# Patient Record
Sex: Male | Born: 1945 | Race: White | Hispanic: No | Marital: Married | Smoking: Former smoker
Health system: Southern US, Community
[De-identification: ages and names within clinical notes are randomized; demographics above are authoritative.]

## PROBLEM LIST (undated history)

## (undated) DIAGNOSIS — I10 Essential (primary) hypertension: Secondary | ICD-10-CM

## (undated) DIAGNOSIS — I714 Abdominal aortic aneurysm, without rupture, unspecified: Secondary | ICD-10-CM

## (undated) DIAGNOSIS — E785 Hyperlipidemia, unspecified: Secondary | ICD-10-CM

## (undated) DIAGNOSIS — E119 Type 2 diabetes mellitus without complications: Secondary | ICD-10-CM

## (undated) DIAGNOSIS — I679 Cerebrovascular disease, unspecified: Secondary | ICD-10-CM

## (undated) DIAGNOSIS — I2510 Atherosclerotic heart disease of native coronary artery without angina pectoris: Secondary | ICD-10-CM

## (undated) HISTORY — DX: Cerebrovascular disease, unspecified: I67.9

## (undated) HISTORY — DX: Essential (primary) hypertension: I10

## (undated) HISTORY — DX: Atherosclerotic heart disease of native coronary artery without angina pectoris: I25.10

## (undated) HISTORY — DX: Hyperlipidemia, unspecified: E78.5

## (undated) HISTORY — DX: Abdominal aortic aneurysm, without rupture: I71.4

## (undated) HISTORY — DX: Abdominal aortic aneurysm, without rupture, unspecified: I71.40

## (undated) HISTORY — DX: Type 2 diabetes mellitus without complications: E11.9

---

## 1999-03-20 ENCOUNTER — Inpatient Hospital Stay (HOSPITAL_COMMUNITY): Admission: EM | Admit: 1999-03-20 | Discharge: 1999-03-24 | Payer: Self-pay | Admitting: Internal Medicine

## 2004-08-15 ENCOUNTER — Ambulatory Visit: Payer: Self-pay | Admitting: Family Medicine

## 2004-09-02 ENCOUNTER — Ambulatory Visit: Payer: Self-pay

## 2004-09-12 ENCOUNTER — Ambulatory Visit: Payer: Self-pay | Admitting: Cardiology

## 2004-12-16 ENCOUNTER — Ambulatory Visit: Payer: Self-pay | Admitting: Family Medicine

## 2005-04-07 ENCOUNTER — Ambulatory Visit: Payer: Self-pay | Admitting: Family Medicine

## 2005-04-17 ENCOUNTER — Ambulatory Visit: Payer: Self-pay | Admitting: Family Medicine

## 2005-07-17 ENCOUNTER — Ambulatory Visit: Payer: Self-pay | Admitting: Family Medicine

## 2005-09-27 ENCOUNTER — Ambulatory Visit: Payer: Self-pay | Admitting: Cardiology

## 2005-10-16 ENCOUNTER — Ambulatory Visit: Payer: Self-pay

## 2006-10-15 ENCOUNTER — Ambulatory Visit: Payer: Self-pay | Admitting: Cardiology

## 2007-10-18 ENCOUNTER — Ambulatory Visit: Payer: Self-pay | Admitting: Cardiology

## 2007-12-27 ENCOUNTER — Ambulatory Visit: Payer: Self-pay

## 2008-09-28 ENCOUNTER — Ambulatory Visit: Payer: Self-pay | Admitting: Cardiology

## 2009-01-26 ENCOUNTER — Encounter: Payer: Self-pay | Admitting: Cardiology

## 2009-03-05 ENCOUNTER — Encounter (INDEPENDENT_AMBULATORY_CARE_PROVIDER_SITE_OTHER): Payer: Self-pay | Admitting: *Deleted

## 2009-11-04 DIAGNOSIS — I2510 Atherosclerotic heart disease of native coronary artery without angina pectoris: Secondary | ICD-10-CM

## 2009-11-04 DIAGNOSIS — I679 Cerebrovascular disease, unspecified: Secondary | ICD-10-CM | POA: Insufficient documentation

## 2009-11-04 DIAGNOSIS — E785 Hyperlipidemia, unspecified: Secondary | ICD-10-CM

## 2009-11-04 DIAGNOSIS — I10 Essential (primary) hypertension: Secondary | ICD-10-CM | POA: Insufficient documentation

## 2009-11-04 DIAGNOSIS — E119 Type 2 diabetes mellitus without complications: Secondary | ICD-10-CM

## 2009-11-08 ENCOUNTER — Ambulatory Visit: Payer: Self-pay | Admitting: Cardiology

## 2009-11-08 DIAGNOSIS — I714 Abdominal aortic aneurysm, without rupture, unspecified: Secondary | ICD-10-CM | POA: Insufficient documentation

## 2009-12-30 ENCOUNTER — Telehealth (INDEPENDENT_AMBULATORY_CARE_PROVIDER_SITE_OTHER): Payer: Self-pay | Admitting: *Deleted

## 2010-01-03 ENCOUNTER — Ambulatory Visit: Payer: Self-pay | Admitting: Internal Medicine

## 2010-01-03 ENCOUNTER — Ambulatory Visit: Payer: Self-pay

## 2010-01-03 ENCOUNTER — Encounter (HOSPITAL_COMMUNITY): Admission: RE | Admit: 2010-01-03 | Discharge: 2010-02-24 | Payer: Self-pay | Admitting: Cardiology

## 2010-01-03 ENCOUNTER — Encounter: Payer: Self-pay | Admitting: Cardiology

## 2010-10-25 NOTE — Progress Notes (Signed)
Summary: Nuclear Pre-Procedure  Phone Note Outgoing Call Call back at [employer] Phone [phone]   Call placed by: Stanton Kidney, EMT-P,  December 30, 2009 2:23 PM Call placed to: Patient Action Taken: Phone Call Completed Summary of Call: Reviewed information on Myoview Information Sheet (see scanned document for further details).  Spoke with Patient.    Nuclear Med Background Indications for Stress Test: Evaluation for Ischemia, Stent Patency, PTCA Patency   History: Myocardial Perfusion Study, Stents  History Comments: '00 Stent/PTCA: RCA '07 MPS: EF= 54%, Inf. apical thinning, (-) ischemia  Symptoms: DOE    Nuclear Pre-Procedure Cardiac Risk Factors: Carotid Disease, History of Smoking, Hypertension, Lipids, NIDDM Height (in): 70

## 2010-10-25 NOTE — Assessment & Plan Note (Signed)
Summary: Cardiology Nuclear Study  Nuclear Med Background Indications for Stress Test: Evaluation for Ischemia, Stent Patency, PTCA Patency   History: Myocardial Perfusion Study, Stents  History Comments: '00 Stent/PTCA: RCA '07 MPS: EF= 54%, Inf. apical thinning, (-) ischemia  Symptoms: DOE    Nuclear Pre-Procedure Cardiac Risk Factors: Carotid Disease, History of Smoking, Hypertension, Lipids, NIDDM Caffeine/Decaff Intake: None NPO After: 7:30 PM Lungs: clear IV 0.9% NS with Angio Cath: 22g     IV Site: (R) AC IV Started by: Irean Hong RN Chest Size (in) 46     Height (in): 70 Weight (lb): 251 BMI: 36.14 Tech Comments: Held carvedilol 24 hrs.  Nuclear Med Study 1 or 2 day study:  1 day     Stress Test Type:  Eugenie Birks Reading MD:  Dietrich Pates, MD     Referring MD:  B.Crenshaw Resting Radionuclide:  Technetium 44m Tetrofosmin     Resting Radionuclide Dose:  11.0 mCi  Stress Radionuclide:  Technetium 18m Tetrofosmin     Stress Radionuclide Dose:  33.0 mCi   Stress Protocol   Lexiscan: 0.4 mg   Stress Test Technologist:  Milana Na EMT-P     Nuclear Technologist:  Domenic Polite CNMT  Rest Procedure  Myocardial perfusion imaging was performed at rest 45 minutes following the intravenous administration of Myoview Technetium 33m Tetrofosmin.  Stress Procedure  The patient received IV Lexiscan 0.4 mg over 15-seconds.  Myoview injected at 30-seconds.  There were no significant changes and chest tightness with infusion.  Quantitative spect images were obtained after a 45 minute delay.   QPS Raw Data Images:  Images were motion corrected.  Extensive soft tissue (dipahragm, bowel activity, subcutaneous fat) surround heart. Stress Images:  Inferior defect (base, mid, distal) inferoseptal defect(base) and apical defect.    Otherwise normal perfusion. Rest Images:  No significant change from the stress images. Subtraction (SDS):  No evidence of ischemia. Transient  Ischemic Dilatation:  1.07  (Normal <1.22)  Lung/Heart Ratio:  .37  (Normal <0.45)  Quantitative Gated Spect Images QGS EDV:  127 ml QGS ESV:  54 ml QGS EF:  58 % QGS cine images:  Mild inferosepta hypokinesis   Overall Impression  Exercise Capacity: Lexiscan protocol. BP Response: Normal blood pressure response. Clinical Symptoms: Atypical chest pain. ECG Impression: No significant ST segment change suggestive of ischemia. Overall Impression Comments: Inferior, inferoseptal changes consistent with scar and possible soft tissue attenuation.  No signficant ischemia. ( unchanged from reprort of previous scan, images not available to compare).  Appended Document: Cardiology Nuclear Study Correction:  Soft tissue (diaphragm, small bowel activity) underlie heart. In the initial stress images there is mild thinning in the distal inferior wall and apex.  Otherwise normal perfusion. IN the recovery images there was no significant change.  (note some difficulty given underlying small bowel activity).    On gating LVEf calculated at 58%  Disregard impression in body of report.  It is incorrect.  Appended Document: Cardiology Nuclear Study ok  Appended Document: Cardiology Nuclear Study pt aware of results

## 2010-10-25 NOTE — Assessment & Plan Note (Signed)
Summary: 1 YR F/U   CC:  yearly visit.  History of Present Illness: Jerry Sutton is a pleasant gentleman who has a history of PCI of his right coronary artery in June 2000.  His most recent Myoview was performed on October 16, 2005.  At that time, he was found to have an ejection fraction of 54%.  There was diaphragmatic attenuation and prominent apical thinning, but there was no ischemia.  His last carotid Dopplers on December 27, 2007, showed 0-39% bilateral stenosis.  His most recent abdominal ultrasound on December 27, 2007, showed a 2.6 x 2.6 dilatation of the distal abdominal aorta.  Both studies were follow-up as recommended in 2 years.  I last saw him in January of 2010. Since then the patient has dyspnea with more extreme activities but not with routine activities. It is relieved with rest. It is not associated with chest pain. There is no orthopnea, PND or pedal edema. There is no syncope or palpitations. There is no exertional chest pain.   Preventive Screening-Counseling & Management  Alcohol-Tobacco     Smoking Status: never  Current Medications (verified): 1)  Nitroglycerin 0.4 Mg Subl (Nitroglycerin) .... As Directed 2)  Lisinopril 40 Mg Tabs (Lisinopril) .... Take One Tablet By Mouth Daily 3)  Amlodipine Besylate 10 Mg Tabs (Amlodipine Besylate) .... 1/2 Tab By Mouth Once Daily 4)  Carvedilol 25 Mg Tabs (Carvedilol) .... 1/2 Tab By Mouth Two Times A Day 5)  Metformin Hcl 1000 Mg Tabs (Metformin Hcl) .Marland Kitchen.. 1 Tab By Mouth Two Times A Day 6)  Multivitamins   Tabs (Multiple Vitamin) .Marland Kitchen.. 1 Tab By Mouth Once Daily 7)  Saw Palmetto   Powd (Saw [place]) .Marland Kitchen.. 1 Tab By Mouth Once Daily 8)  Hydrochlorothiazide 25 Mg Tabs (Hydrochlorothiazide) .Marland Kitchen.. 1 Tab By Mouth Once Daily 9)  Actos 45 Mg Tabs (Pioglitazone Hcl) .Marland Kitchen.. 1 Tab By Mouth Once Daily 10)  Glimepiride 4 Mg Tabs (Glimepiride) .... 1/2 Tab By Mouth Once Daily 11)  Aspirin Ec 325 Mg Tbec (Aspirin) .... Take One Tablet By Mouth Daily 12)   Simvastatin 80 Mg Tabs (Simvastatin) .... Take One Tablet By Mouth Daily At Bedtime 13)  Niacin Cr 750 Mg Cr-Tabs (Niacin) .Marland Kitchen.. 1 Tab By Mouth Once Daily  Past History:  Past Medical History: Reviewed history from 11/04/2009 and no changes required. Current Problems:  CEREBROVASCULAR DISEASE (ICD-437.9) HYPERLIPIDEMIA (ICD-272.4) HYPERTENSION (ICD-401.9) CAD (ICD-414.00) DM (ICD-250.00)  mildly dilated abdominal aorta  Past Surgical History: Reviewed history from 11/04/2009 and no changes required.  PCI of his right coronary artery in June 2000.  Social History: Reviewed history from 11/04/2009 and no changes required. Married  Tobacco Use - No.  Smoking Status:  never  Review of Systems       Mild athralgias but no fevers or chills, productive cough, hemoptysis, dysphasia, odynophagia, melena, hematochezia, dysuria, hematuria, rash, seizure activity, orthopnea, PND, pedal edema, claudication. Remaining systems are negative.   Vital Signs:  Patient profile:   65 year old male Height:      70 inches Weight:      244 pounds BMI:     35.14 Pulse rate:   65 / minute Resp:     14 per minute BP sitting:   110 / 60  (left arm)  Vitals Entered By: Kem Parkinson (November 08, 2009 8:48 AM)  Physical Exam  General:  Well-developed well-nourished in no acute distress.  Skin is warm and dry.  HEENT is normal.  Neck is  supple. No thyromegaly.  Chest is clear to auscultation with normal expansion.  Cardiovascular exam is regular rate and rhythm.  Abdominal exam nontender or distended. No masses palpated. Extremities show no edema. neuro grossly intact    EKG  Procedure date:  11/08/2009  Findings:      Sinus rhythm at a rate of 65. No ST changes.  Impression & Recommendations:  Problem # 1:  CEREBROVASCULAR DISEASE (ICD-437.9) Continue aspirin and statin. Schedule followup carotid Dopplers in April of 2011. Orders: Carotid Duplex (Carotid Duplex)  Problem #  2:  AAA (ICD-441.4) Schedule followup abdominal ultrasound in April of 2011.  Problem # 3:  HYPERLIPIDEMIA (ICD-272.4) Continue statin and niacin. Lipids and liver monitored by his primary care. His updated medication list for this problem includes:    Simvastatin 80 Mg Tabs (Simvastatin) .Marland Kitchen... Take one tablet by mouth daily at bedtime    Niacin Cr 750 Mg Cr-tabs (Niacin) .Marland Kitchen... 1 tab by mouth once daily  Problem # 4:  HYPERTENSION (ICD-401.9) Blood pressure is controlled on present medications. Will continue. Renal function monitored by his primary care. His updated medication list for this problem includes:    Lisinopril 40 Mg Tabs (Lisinopril) .Marland Kitchen... Take one tablet by mouth daily    Amlodipine Besylate 10 Mg Tabs (Amlodipine besylate) .Marland Kitchen... 1/2 tab by mouth once daily    Carvedilol 25 Mg Tabs (Carvedilol) .Marland Kitchen... 1/2 tab by mouth two times a day    Hydrochlorothiazide 25 Mg Tabs (Hydrochlorothiazide) .Marland Kitchen... 1 tab by mouth once daily    Aspirin Ec 325 Mg Tbec (Aspirin) .Marland Kitchen... Take one tablet by mouth daily  Problem # 5:  CAD (ICD-414.00) Some dyspnea on exertion. Greater then 3 years since his previous functional study. Schedule stress Myoview for risk stratification. Continue aspirin, beta blocker, ACE inhibitor and statin. Continue diet and exercise. He does not smoke. His updated medication list for this problem includes:    Nitroglycerin 0.4 Mg Subl (Nitroglycerin) .Marland Kitchen... As directed    Lisinopril 40 Mg Tabs (Lisinopril) .Marland Kitchen... Take one tablet by mouth daily    Amlodipine Besylate 10 Mg Tabs (Amlodipine besylate) .Marland Kitchen... 1/2 tab by mouth once daily    Carvedilol 25 Mg Tabs (Carvedilol) .Marland Kitchen... 1/2 tab by mouth two times a day    Aspirin Ec 325 Mg Tbec (Aspirin) .Marland Kitchen... Take one tablet by mouth daily  Orders: Nuclear Stress Test (Nuc Stress Test)  Problem # 6:  DM (ICD-250.00) Management per primary care. His updated medication list for this problem includes:    Lisinopril 40 Mg Tabs  (Lisinopril) .Marland Kitchen... Take one tablet by mouth daily    Metformin Hcl 1000 Mg Tabs (Metformin hcl) .Marland Kitchen... 1 tab by mouth two times a day    Actos 45 Mg Tabs (Pioglitazone hcl) .Marland Kitchen... 1 tab by mouth once daily    Glimepiride 4 Mg Tabs (Glimepiride) .Marland Kitchen... 1/2 tab by mouth once daily    Aspirin Ec 325 Mg Tbec (Aspirin) .Marland Kitchen... Take one tablet by mouth daily  Other Orders: Abdominal Aorta Duplex (Abd Aorta Duplex)  Patient Instructions: 1)  Your physician recommends that you schedule a follow-up appointment in: ONE YEAR 2)  Your physician has requested that you have an abdominal aorta duplex. During this test, an ultrasound is used to evaluate the aorta. Allow 30 minutes for this exam. Do not eat after midnight the day before and avoid carbonated beverages. There are no restrictions or special instructions. 3)  Your physician has requested that you have a carotid duplex.  This test is an ultrasound of the carotid arteries in your neck. It looks at blood flow through these arteries that supply the brain with blood. Allow one hour for this exam. There are no restrictions or special instructions. 4)  Your physician has requested that you have an exercise stress myoview.  For further information please visit [email or web address]  Please follow instruction sheet, as given. 5)  SCHEDULE ALL TEST IN APRIL

## 2011-02-07 NOTE — Assessment & Plan Note (Signed)
[facility]                            CARDIOLOGY OFFICE NOTE   Jerry Sutton, Jerry Sutton Jerry Sutton                     MRN:          [id number]  DATE:10/18/2007                            DOB:          Apr 24, 1946    [name] returns for followup today.  He has a history of PCI of his  right coronary artery in June 2007.  His most recent Myoview was  performed in January of 2007.  At that time, his ejection fraction was  54%, and there was diaphragmatic attenuation and apical thinning but no  ischemia.  He also has a history of a mildly dilated abdominal aorta and  minimal plaque in his carotids.  Since I last saw him, he is doing well.  There is no dyspnea, chest pain, palpitations or syncope.  There is no  pedal edema.   His medications at present include:  1. Lopressor 1 mg p.o. b.i.d.  2. Metformin 1000 mg p.o. b.i.d.  3. Multivitamin.  4. Saw palmetto.  5. Aspirin 325 mg daily.  6. Zocor 80 mg daily.  7. Niaspan 2 grams p.o. daily.  8. Zetia 10 mg p.o. daily.  9. Hydrochlorothiazide 12.5 mg p.o. daily.  10.Actos 45 mg p.o. daily.  11.Betamethasone.  12.Amlodipine 5 mg daily.  13.Lisinopril 40 mg p.o. b.i.d.  14.Glimepiride 3 mg p.o. b.i.d.   PHYSICAL EXAMINATION:  Today shows a blood pressure of 116/69 and a  pulse of 75.  He weighs 240 pounds.  HEENT:  Normal.  NECK:  Supple.  CHEST:  Clear.  CARDIOVASCULAR:  Regular rate and rhythm.  ABDOMINAL EXAM:  Shows no tenderness.  EXTREMITIES:  Show no edema.   Electrocardiogram shows sinus rhythm at a rate of 70.  There is left  axis deviation.  There is no ST changes.   DIAGNOSES:  1. Coronary disease with history of PCI of his right coronary - he is      having no chest pain, and his most recent Myoview was low risk.  We      will continue his medical therapy including his aspirin, statin,      beta blocker, and ACE inhibitor.  He will continue risk factor      modification including diet and  exercise, and we discussed that      today.  2. Diabetes mellitus - per Dr. Benedetto Sutton.  3. Hypertension - his blood pressure is adequately controlled on his      present medications.  4. Hyperlipidemia - he will continue on his statin, niacin and Zetia.      Will have his most recent lipids, liver and renal function      forwarded to [place] for our records.  5. He needs a repeat abdominal ultrasound.  His aorta was minimally      dilated in the past.  We will also plan to repeat his carotid      Dopplers as he had mild cerebrovascular disease in the past.  He      will see [place] back in 1 year.     Jerry Frieze Jens Som, MD,  [facility]  Electronically Signed    BSC/MedQ  DD: 10/18/2007  DT: 10/18/2007  Job #: [id number]   cc:   Jerry Sutton, M.D.

## 2011-02-07 NOTE — Assessment & Plan Note (Signed)
[facility]                            CARDIOLOGY OFFICE NOTE   Zekiah, Caruth GRAM SIEDLECKI                     MRN:          [id number]  DATE:09/28/2008                            DOB:          05/07/1946    [name] is a pleasant gentleman who has a history of PCI of his right  coronary artery in June 2000.  His most recent Myoview was performed on  October 16, 2005.  At that time, he was found to have an ejection  fraction of 54%.  There was diaphragmatic attenuation and prominent  apical thinning, but there was no ischemia.  His last carotid Dopplers  on December 27, 2007, showed 0-39% bilateral stenosis.  His most recent  abdominal ultrasound on December 27, 2007, showed a 2.6 x 2.6 dilatation of  the distal abdominal aorta.  Both studies were follow-up as recommended  in 2 years.  Since I last saw him, he is doing well from symptomatic  standpoint with no chest pain, dyspnea, palpitations, or syncope.  There  is no pedal edema.   MEDICATIONS:  1. Amlodipine 10 mg p.o. daily.  2. Glimepiride 4 mg p.o. daily.  3. Metoprolol 100 mg p.o. b.i.d.  4. Metformin 1000 mg p.o. b.i.d.  5. Multivitamin.  6. Supplemental aspirin 325 mg p.o. daily.  7. Zocor 80 mg daily.  8. Niacin 2 g p.o. daily.  9. Hydrochlorothiazide 12.5 mg p.o. daily.  10.Actos.  11.Betamethasone.  12.Lisinopril 40 mg p.o. b.i.d.   PHYSICAL EXAMINATION:  VITAL SIGNS:  Blood pressure of 120/70, his pulse  is 66.  He weighs 245 pounds.  HEENT:  Normal.  NECK:  Supple.  CHEST:  Clear.  CARDIOVASCULAR:  Regular rate and rhythm.  ABDOMEN:  No tenderness.  EXTREMITIES:  No edema.   Electrocardiogram shows a sinus rhythm at a rate of 66.  There is left  axis deviation.  There are no ST changes noted.   DIAGNOSES:  1. Coronary artery disease status post percutaneous coronary      intervention of the right coronary artery - the patient is doing      well from symptomatic standpoint.  He  will continue with his      aspirin, beta-blocker, ACE inhibitor, and statin.  He will continue      risk factor modification including diet and exercise.  He does not      smoke.  2. Hypertension - his blood pressure is adequately controlled on his      present medications.  Dr. Bernette Mayers is following his renal function      and potassium.  3. Diabetes mellitus - managed per Dr. Bernette Mayers.  4. Hyperlipidemia - he will continue on his statin and niacin.  His      lipids and liver being followed by Dr. Bernette Mayers.  5. History of mildly dilated abdominal aorta - he will need followup      abdominal ultrasound in April 2011.  6. Mild cerebrovascular disease - he will need followup carotid      Dopplers in April 2011.   I will see him  back in 1 year.  We will most likely repeat his Myoview  at that time.     Madolyn Frieze Jens Som, MD, [facility]  Electronically Signed    BSC/MedQ  DD: 09/28/2008  DT: 09/29/2008  Job #: [id number]   cc:   Elease Hashimoto A. Benedetto Goad, M.D.

## 2011-02-10 NOTE — Assessment & Plan Note (Signed)
[facility]                            CARDIOLOGY OFFICE NOTE   Jerry Sutton, Jerry Sutton                     MRN:          [id number]  DATE:10/15/2006                            DOB:          Feb 25, 1946    Jerry Sutton returns for followup today. He is a pleasant gentleman who  has a history of coronary disease. When I last saw him in January 2007,  he was under a significant amount of stress and was complaining of  occasional chest pain. We did schedule him to have a nuclear study,  which was performed on October 16, 2005. At that time, he was found to  have an ejection fraction of 54%. There was diaphragmatic attentuation  and apical thinning, but there was no significant ischemia noted. He had  an ultrasound of his abdomen in January of 2007, that showed mild  dilatation of the distal aorta measuring 2.3 x 2.2 cm. Followup was  recommended in 2 years. Since I last saw him, he is doing well. There is  no dyspnea, chest pain, palpitations, or syncope.   His medications include:  1. Metoprolol 100 mg p.o. b.i.d.  2. Felodipine 5 mg p.o. daily.  3. Lisinopril 40 mg tablets one-half p.o. b.i.d.  4. Metformin.  5. Multivitamin.  6. Saw palmetto.  7. Glimepiride 4 mg p.o. daily.  8. Aspirin 325 mg p.o. daily.  9. Zocor 80 mg p.o. nightly.  10.Niacin 1 g p.o. daily.  11.Zetia 10 mg p.o. daily.  12.Hydrochlorothiazide 12.5 mg p.o. daily.  13.Actos.  14.Betamethasone.   PHYSICAL EXAMINATION:  Shows a blood pressure of 121/71, pulse of 72. He  weighs 241 pounds.  NECK: Supple, with no bruits.  CHEST: Clear.  CARDIOVASCULAR: Reveals a regular rate and rhythm.  EXTREMITIES: Show no edema.   His electrocardiogram shows a sinus rhythm at a rate of 68. There are no  ST changes noted.   I do have lipids from June 04, 2006. At that time, his total  cholesterol was 118, with an HDL of 61 and an LDL of 42. His liver  functions were normal.   DIAGNOSES:  1. Coronary artery disease, status post percutaneous coronary      intervention (PCI) of the right coronary artery.  2. Diabetes mellitus.  3. Hypertension.  4. Hyperlipidemia.   PLAN:  Jerry Sutton is doing well from a symptomatic standpoint. His  lipids recently were outstanding. His blood pressure is well-controlled  on his present medications. We discussed diet and exercise. He does not  smoke. I will have him return to see [place] in 1 year. We will most likely  repeat the abdominal ultrasound at that time as there was mild  dilatation and 2 year followup was recommended.     Madolyn Frieze Jens Som, MD, [facility]  Electronically Signed    BSC/MedQ  DD: 10/15/2006  DT: 10/15/2006  Job #: [id number]   cc:   Elease Hashimoto A. Benedetto Goad, M.D.

## 2012-05-28 ENCOUNTER — Telehealth: Payer: Self-pay | Admitting: Cardiology

## 2012-05-28 MED ORDER — NITROGLYCERIN 0.4 MG SL SUBL: 0.4000 mg | SUBLINGUAL_TABLET | SUBLINGUAL | Status: DC | PRN

## 2012-05-28 NOTE — Telephone Encounter (Signed)
Pt request refill on nitro pills, no RX available.  Please send RX to [facility] in [place] and advise pt of med refill.

## 2012-06-19 ENCOUNTER — Encounter: Payer: Self-pay | Admitting: Cardiology

## 2012-06-19 ENCOUNTER — Encounter: Payer: Self-pay | Admitting: *Deleted

## 2012-06-20 ENCOUNTER — Encounter: Payer: Self-pay | Admitting: Cardiology

## 2012-06-20 ENCOUNTER — Ambulatory Visit (INDEPENDENT_AMBULATORY_CARE_PROVIDER_SITE_OTHER): Payer: Medicare Other | Admitting: Cardiology

## 2012-06-20 VITALS — BP 113/71 | HR 63 | Ht 70.0 in | Wt 251.0 lb

## 2012-06-20 DIAGNOSIS — E785 Hyperlipidemia, unspecified: Secondary | ICD-10-CM

## 2012-06-20 DIAGNOSIS — I2510 Atherosclerotic heart disease of native coronary artery without angina pectoris: Secondary | ICD-10-CM

## 2012-06-20 DIAGNOSIS — I714 Abdominal aortic aneurysm, without rupture: Secondary | ICD-10-CM

## 2012-06-20 DIAGNOSIS — I679 Cerebrovascular disease, unspecified: Secondary | ICD-10-CM

## 2012-06-20 NOTE — Assessment & Plan Note (Signed)
Continue statin. 

## 2012-06-20 NOTE — Assessment & Plan Note (Signed)
Plan follow-up abdominal ultrasound. 

## 2012-06-20 NOTE — Assessment & Plan Note (Signed)
Continue aspirin and statin. Plan followup carotid Dopplers in one year.

## 2012-06-20 NOTE — Assessment & Plan Note (Signed)
Blood pressure controlled. Continue present medications. 

## 2012-06-20 NOTE — Progress Notes (Signed)
   HPI:Mr. Lahue is a pleasant gentleman who has a history of PCI of his right coronary artery in June 2000.  His most recent Myoview was performed in April 2011.  At that time, he was found to have an ejection fraction of 58%.  There was inferior and inferoseptal scar/soft tissue attenuation, but there was no ischemia.  His last carotid Dopplers in April 2011 showed 0-39% bilateral stenosis.  His most recent abdominal ultrasound in April of 2011 showed a 2.6 x 2.7 dilatation of the distal abdominal aorta. I last saw him in Feb of 2011. Since then the patient has dyspnea with more extreme activities but not with routine activities. It is relieved with rest. It is not associated with chest pain. There is no orthopnea, PND or pedal edema. There is no syncope or palpitations. There is no exertional chest pain.  Current Outpatient Prescriptions  Medication Sig Dispense Refill  . aspirin 325 MG tablet Take 325 mg by mouth daily.      Marland Kitchen atorvastatin (LIPITOR) 80 MG tablet Take 80 mg by mouth daily.      . betamethasone valerate (VALISONE) 0.1 % cream Apply topically 2 (two) times daily.      . carvedilol (COREG) 25 MG tablet Take 12.5 mg by mouth daily.      Marland Kitchen glimepiride (AMARYL) 4 MG tablet Take 4 mg by mouth daily before breakfast.      . lisinopril (PRINIVIL,ZESTRIL) 40 MG tablet Take 40 mg by mouth daily.      . niacin (NIASPAN) 750 MG CR tablet Take 750 mg by mouth at bedtime.      . nitroGLYCERIN (NITROSTAT) 0.4 MG SL tablet Place 1 tablet (0.4 mg total) under the tongue every 5 (five) minutes as needed for chest pain.  25 tablet  12     Past Medical History  Diagnosis Date  . DM   . HYPERLIPIDEMIA   . HYPERTENSION   . CAD   . CEREBROVASCULAR DISEASE   . AAA     No past surgical history on file.  History   Social History  . Marital Status: Married    Spouse Name: N/A    Number of Children: N/A  . Years of Education: N/A   Occupational History  . Not on file.   Social History  Main Topics  . Smoking status: Former Games developer  . Smokeless tobacco: Not on file  . Alcohol Use: Yes  . Drug Use: No  . Sexually Active: Not on file   Other Topics Concern  . Not on file   Social History Narrative  . No narrative on file    ROS: some back pain but no fevers or chills, productive cough, hemoptysis, dysphasia, odynophagia, melena, hematochezia, dysuria, hematuria, rash, seizure activity, orthopnea, PND, pedal edema, claudication. Remaining systems are negative.  Physical Exam: Well-developed obese in no acute distress.  Skin is warm and dry.  HEENT is normal.  Neck is supple.  Chest is clear to auscultation with normal expansion.  Cardiovascular exam is regular rate and rhythm.  Abdominal exam nontender or distended. No masses palpated. Extremities show no edema. neuro grossly intact  ECG  Sinus rhythm at a rate of 63. Left axis deviation. No ST changes.

## 2012-06-20 NOTE — Patient Instructions (Addendum)
Your physician wants you to follow-up in: ONE YEAR WITH DR CRENSHAW You will receive a reminder letter in the mail two months in advance. If you don't receive a letter, please call our office to schedule the follow-up appointment.   Your physician has requested that you have an abdominal aorta duplex. During this test, an ultrasound is used to evaluate the aorta. Allow 30 minutes for this exam. Do not eat after midnight the day before and avoid carbonated beverages  

## 2012-06-20 NOTE — Assessment & Plan Note (Signed)
Continue aspirin at decreased dose to 81 mg daily. Continue statin. Plan Myoview when he returns in one year.

## 2012-07-10 ENCOUNTER — Encounter (INDEPENDENT_AMBULATORY_CARE_PROVIDER_SITE_OTHER): Payer: Medicare Other

## 2012-07-10 DIAGNOSIS — I714 Abdominal aortic aneurysm, without rupture: Secondary | ICD-10-CM

## 2012-07-10 DIAGNOSIS — I700 Atherosclerosis of aorta: Secondary | ICD-10-CM

## 2013-01-14 ENCOUNTER — Encounter: Payer: Self-pay | Admitting: Cardiology

## 2013-01-14 ENCOUNTER — Ambulatory Visit (INDEPENDENT_AMBULATORY_CARE_PROVIDER_SITE_OTHER): Payer: Medicare Other | Admitting: Cardiology

## 2013-01-14 VITALS — BP 93/63 | HR 71 | Ht 70.0 in | Wt 230.0 lb

## 2013-01-14 DIAGNOSIS — E785 Hyperlipidemia, unspecified: Secondary | ICD-10-CM

## 2013-01-14 DIAGNOSIS — I10 Essential (primary) hypertension: Secondary | ICD-10-CM

## 2013-01-14 DIAGNOSIS — I679 Cerebrovascular disease, unspecified: Secondary | ICD-10-CM

## 2013-01-14 DIAGNOSIS — I714 Abdominal aortic aneurysm, without rupture: Secondary | ICD-10-CM

## 2013-01-14 DIAGNOSIS — I2510 Atherosclerotic heart disease of native coronary artery without angina pectoris: Secondary | ICD-10-CM

## 2013-01-14 MED ORDER — AMLODIPINE BESYLATE 5 MG PO TABS: 5.0000 mg | ORAL_TABLET | Freq: Every day | ORAL | Status: DC

## 2013-01-14 NOTE — Assessment & Plan Note (Signed)
Continue aspirin and statin. 

## 2013-01-14 NOTE — Patient Instructions (Addendum)
Your physician wants you to follow-up in: ONE YEAR WITH DR Shelda Pal will receive a reminder letter in the mail two months in advance. If you don't receive a letter, please call our office to schedule the follow-up appointment.   DECREASE AMLODIPINE TO 5 MG ONCE DAILY  STOP HCTZ  Your physician has requested that you have a lexiscan myoview. For further information please visit [email or web address] Please follow instruction sheet, as given.

## 2013-01-14 NOTE — Assessment & Plan Note (Signed)
Continue statin. Lipids and liver monitored by primary care. 

## 2013-01-14 NOTE — Assessment & Plan Note (Signed)
Blood pressure is running low. He has some dizziness with standing. Discontinue HCTZ and decrease Norvasc to 5 mg daily. Follow blood pressure and adjust as needed.

## 2013-01-14 NOTE — Assessment & Plan Note (Signed)
Continue aspirin and statin. Schedule Myoview for risk stratification. 

## 2013-01-14 NOTE — Progress Notes (Signed)
HPI: [name] is a pleasant gentleman who has a history of PCI of his right coronary artery in June 2000. His most recent Myoview was performed in April 2011. At that time, he was found to have an ejection fraction of 58%. There was inferior and inferoseptal scar/soft tissue attenuation, but there was no ischemia. His last carotid Dopplers in April 2011 showed 0-39% bilateral stenosis. His most recent abdominal ultrasound in October of 2013 showed mild fusiform dilatation but no frank aneurysm. Followup not recommended. I last saw him in Sept 2013. Since then the patient has dyspnea with more extreme activities but not with routine activities. It is relieved with rest. It is not associated with chest pain. There is no orthopnea, PND or pedal edema. There is no syncope or palpitations. There is no exertional chest pain. He has had some dizziness with standing.   Current Outpatient Prescriptions  Medication Sig Dispense Refill  . amLODipine (NORVASC) 10 MG tablet Take 10 mg by mouth daily.      Marland Kitchen aspirin 325 MG tablet Take 1/2 tablet daily      . atorvastatin (LIPITOR) 80 MG tablet Take 80 mg by mouth daily.      . betamethasone valerate (VALISONE) 0.1 % cream Apply topically 2 (two) times daily.      . carvedilol (COREG) 25 MG tablet Take 12.5 mg by mouth 2 (two) times daily with a meal.       . glimepiride (AMARYL) 4 MG tablet Take 1 1/2 tablet daily      . hydrochlorothiazide (HYDRODIURIL) 25 MG tablet Take 12.5 mg by mouth daily.      Marland Kitchen lisinopril (PRINIVIL,ZESTRIL) 40 MG tablet Take 40 mg by mouth daily.      . metFORMIN (GLUCOPHAGE) 1000 MG tablet Take 1,000 mg by mouth 2 (two) times daily with a meal.      . niacin (NIASPAN) 750 MG CR tablet Take 750 mg by mouth at bedtime.      . nitroGLYCERIN (NITROSTAT) 0.4 MG SL tablet Place 1 tablet (0.4 mg total) under the tongue every 5 (five) minutes as needed for chest pain.  25 tablet  12  . pioglitazone (ACTOS) 45 MG tablet Take one daily        No current facility-administered medications for this visit.     Past Medical History  Diagnosis Date  . DM   . HYPERLIPIDEMIA   . HYPERTENSION   . CAD   . CEREBROVASCULAR DISEASE   . AAA     History reviewed. No pertinent past surgical history.  History   Social History  . Marital Status: Married    Spouse Name: N/A    Number of Children: N/A  . Years of Education: N/A   Occupational History  . Not on file.   Social History Main Topics  . Smoking status: Former Games developer  . Smokeless tobacco: Not on file  . Alcohol Use: Yes  . Drug Use: No  . Sexually Active: Not on file   Other Topics Concern  . Not on file   Social History Narrative  . No narrative on file    ROS: no fevers or chills, productive cough, hemoptysis, dysphasia, odynophagia, melena, hematochezia, dysuria, hematuria, rash, seizure activity, orthopnea, PND, pedal edema, claudication. Remaining systems are negative.  Physical Exam: Well-developed well-nourished in no acute distress.  Skin is warm and dry.  HEENT is normal.  Neck is supple.  Chest is clear to auscultation with normal expansion.  Cardiovascular  exam is regular rate and rhythm.  Abdominal exam nontender or distended. No masses palpated. Extremities show no edema. neuro grossly intact  ECG sinus rhythm at a rate of 71. No ST changes.

## 2013-01-14 NOTE — Assessment & Plan Note (Signed)
Mild fusiform dilatation on most recent ultrasound and no followup recommended.

## 2013-01-29 ENCOUNTER — Ambulatory Visit (HOSPITAL_COMMUNITY): Payer: Medicare Other | Attending: Cardiovascular Disease | Admitting: Radiology

## 2013-01-29 VITALS — BP 112/62 | Ht 70.5 in | Wt 232.0 lb

## 2013-01-29 DIAGNOSIS — I6529 Occlusion and stenosis of unspecified carotid artery: Secondary | ICD-10-CM | POA: Insufficient documentation

## 2013-01-29 DIAGNOSIS — I252 Old myocardial infarction: Secondary | ICD-10-CM | POA: Insufficient documentation

## 2013-01-29 DIAGNOSIS — R0989 Other specified symptoms and signs involving the circulatory and respiratory systems: Secondary | ICD-10-CM | POA: Insufficient documentation

## 2013-01-29 DIAGNOSIS — R0609 Other forms of dyspnea: Secondary | ICD-10-CM | POA: Insufficient documentation

## 2013-01-29 DIAGNOSIS — I2510 Atherosclerotic heart disease of native coronary artery without angina pectoris: Secondary | ICD-10-CM

## 2013-01-29 DIAGNOSIS — R5381 Other malaise: Secondary | ICD-10-CM | POA: Insufficient documentation

## 2013-01-29 DIAGNOSIS — E119 Type 2 diabetes mellitus without complications: Secondary | ICD-10-CM | POA: Insufficient documentation

## 2013-01-29 DIAGNOSIS — I10 Essential (primary) hypertension: Secondary | ICD-10-CM | POA: Insufficient documentation

## 2013-01-29 DIAGNOSIS — R0602 Shortness of breath: Secondary | ICD-10-CM

## 2013-01-29 DIAGNOSIS — R42 Dizziness and giddiness: Secondary | ICD-10-CM | POA: Insufficient documentation

## 2013-01-29 DIAGNOSIS — Z87891 Personal history of nicotine dependence: Secondary | ICD-10-CM | POA: Insufficient documentation

## 2013-01-29 MED ORDER — REGADENOSON 0.4 MG/5ML IV SOLN
0.4000 mg | Freq: Once | INTRAVENOUS | Status: AC
  Administered 2013-01-29: 0.4 mg via INTRAVENOUS

## 2013-01-29 MED ORDER — TECHNETIUM TC 99M SESTAMIBI GENERIC - CARDIOLITE
30.0000 | Freq: Once | INTRAVENOUS | Status: AC | PRN
  Administered 2013-01-29: 30 via INTRAVENOUS

## 2013-01-29 MED ORDER — TECHNETIUM TC 99M SESTAMIBI GENERIC - CARDIOLITE
10.0000 | Freq: Once | INTRAVENOUS | Status: AC | PRN
  Administered 2013-01-29: 10 via INTRAVENOUS

## 2013-01-29 NOTE — Progress Notes (Signed)
[facility] SITE 3 NUCLEAR MED [address] [place], [place] [address] [phone]    Cardiology Nuclear Med Study  Jerry Sutton. is a 67 y.o. male     MRN : [id number]     DOB: 1946-03-17  Procedure Date: 01/29/2013  Nuclear Med Background Indication for Stress Test:  Evaluation for Ischemia and PTCA/Stent Patency History:  '00 Non Q wave MI> Cath: no report available> Stent RCA, and 01-03-10 Myocardial Perfusion Study-inferior, inferoseptal changes consistent with scar, no ischemia EF=58% Cardiac Risk Factors: Carotid Disease, History of Smoking, Hypertension, Lipids and NIDDM  Symptoms:  Diaphoresis, Dizziness, DOE and Fatigue   Nuclear Pre-Procedure Caffeine/Decaff Intake:  None > 12 hrs NPO After: 5:30pm   Lungs:  clear O2 Sat: 95% on room air. IV 0.9% NS with Angio Cath:  22g  IV Site: R Wrist x 1, tolerated well IV Started by:  Irean Hong, RN  Chest Size (in):  44 Cup Size: n/a  Height: 5' 10.5" (1.791 m)  Weight:  232 lb (105.235 kg)  BMI:  Body mass index is 32.81 kg/(m^2). 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 Irean Hong, RN    Nuclear Med Study 1 or 2 day study: 1 day  Stress Test Type:  Treadmill/Lexiscan  Reading MD: Charlton Haws, MD  Order Authorizing Provider:  Olga Millers, MD  Resting Radionuclide: Technetium 46m Sestamibi  Resting Radionuclide Dose: 11.0 mCi   Stress Radionuclide:  Technetium 41m Sestamibi  Stress Radionuclide Dose: 33.0 mCi           Stress Protocol Rest HR: 61 Stress HR: 98  Rest BP: 112/62 Stress BP: 133/57  Exercise Time (min): 2:00 METS: n/a   Predicted Max HR: 153 bpm % Max HR: 64.05 bpm Rate Pressure Product: 72536   Dose of Adenosine (mg):  n/a Dose of Lexiscan: 0.4 mg  Dose of Atropine (mg): n/a Dose of Dobutamine: n/a mcg/kg/min (at max HR)  Stress Test Technologist: Irean Hong, RN  Nuclear Technologist:  Domenic Polite, CNMT     Rest Procedure:  Myocardial perfusion imaging was  performed at rest 45 minutes following the intravenous administration of Technetium 35m Sestamibi. Rest ECG: NSR - Normal EKG  Stress Procedure:  The patient received IV Lexiscan 0.4 mg over 15-seconds with concurrent low level exercise and then Technetium 43m Sestamibi was injected at 30-seconds while the patient continued walking one more minute.  The patient complained of chest tightness, and DOE with Lexiscan. Quantitative spect images were obtained after a 45-minute delay. Stress ECG: No significant change from baseline ECG  QPS Raw Data Images:  Patient motion noted. Stress Images:  Normal homogeneous uptake in all areas of the myocardium. Rest Images:  Normal homogeneous uptake in all areas of the myocardium. Subtraction (SDS):  SDS 4 Transient Ischemic Dilatation (Normal <1.22):  1.11 Lung/Heart Ratio (Normal <0.45):  0.37  Quantitative Gated Spect Images QGS EDV:  132 ml QGS ESV:  54 ml  Impression Exercise Capacity:  Lexiscan with low level exercise. BP Response:  Normal blood pressure response. Clinical Symptoms:  Atypical chest pain. ECG Impression:  No significant ST segment change suggestive of ischemia. Comparison with Prior Nuclear Study: No images to compare  Overall Impression:  Low risk stress nuclear study Apical thinning and diaphragmatic attenuation and bowel artifact obscuring some of inferior wall. Neither thoguht to be significant.  LV Ejection Fraction: 59%.  LV Wall Motion:  NL LV Function; NL Wall Motion   Jerry Sutton  [employer]

## 2014-01-29 ENCOUNTER — Encounter: Payer: Self-pay | Admitting: Cardiology

## 2014-01-29 ENCOUNTER — Ambulatory Visit (INDEPENDENT_AMBULATORY_CARE_PROVIDER_SITE_OTHER): Payer: Medicare Other | Admitting: Cardiology

## 2014-01-29 VITALS — BP 122/64 | HR 69 | Ht 70.5 in | Wt 240.0 lb

## 2014-01-29 DIAGNOSIS — I2510 Atherosclerotic heart disease of native coronary artery without angina pectoris: Secondary | ICD-10-CM

## 2014-01-29 DIAGNOSIS — I714 Abdominal aortic aneurysm, without rupture, unspecified: Secondary | ICD-10-CM

## 2014-01-29 DIAGNOSIS — I679 Cerebrovascular disease, unspecified: Secondary | ICD-10-CM

## 2014-01-29 DIAGNOSIS — E785 Hyperlipidemia, unspecified: Secondary | ICD-10-CM

## 2014-01-29 DIAGNOSIS — I10 Essential (primary) hypertension: Secondary | ICD-10-CM

## 2014-01-29 NOTE — Assessment & Plan Note (Signed)
Continue aspirin and statin. 

## 2014-01-29 NOTE — Assessment & Plan Note (Signed)
Continue statin. 

## 2014-01-29 NOTE — Assessment & Plan Note (Signed)
Mild fusiform dilatation on most recent study.

## 2014-01-29 NOTE — Patient Instructions (Signed)
Your physician wants you to follow-up in: ONE YEAR WITH DR CRENSHAW You will receive a reminder letter in the mail two months in advance. If you don't receive a letter, please call our office to schedule the follow-up appointment.  

## 2014-01-29 NOTE — Progress Notes (Signed)
      HPI: FU CAD; history of PCI of his right coronary artery in June 2000. His last carotid Dopplers in April 2011 showed 0-39% bilateral stenosis. His most recent abdominal ultrasound in October of 2013 showed mild fusiform dilatation but no frank aneurysm. Followup not recommended. Nuclear study may 2014 showed an ejection fraction of 59%. There was soft tissue attenuation but no ischemia. I last saw him in May 2014. Since then the patient has dyspnea with more extreme activities but not with routine activities. It is relieved with rest. It is not associated with chest pain. There is no orthopnea, PND or pedal edema. There is no syncope or palpitations. There is no exertional chest pain.    Current Outpatient Prescriptions  Medication Sig Dispense Refill  . amLODipine (NORVASC) 5 MG tablet Take 1 tablet (5 mg total) by mouth daily.  30 tablet  12  . aspirin 325 MG tablet Take 1/2 tablet daily      . atorvastatin (LIPITOR) 80 MG tablet Take 80 mg by mouth daily.      . betamethasone valerate (VALISONE) 0.1 % cream Apply topically 2 (two) times daily.      . carvedilol (COREG) 25 MG tablet Take 12.5 mg by mouth 2 (two) times daily with a meal.       . glimepiride (AMARYL) 4 MG tablet Take 1 1/2 tablet daily      . lisinopril (PRINIVIL,ZESTRIL) 40 MG tablet Take 40 mg by mouth daily.      . metFORMIN (GLUCOPHAGE) 1000 MG tablet Take 1,000 mg by mouth 2 (two) times daily with a meal.      . niacin (SLO-NIACIN) 500 MG tablet Take 500 mg by mouth at bedtime.      . pioglitazone (ACTOS) 45 MG tablet Take one daily      . nitroGLYCERIN (NITROSTAT) 0.4 MG SL tablet Place 1 tablet (0.4 mg total) under the tongue every 5 (five) minutes as needed for chest pain.  25 tablet  12   No current facility-administered medications for this visit.     Past Medical History  Diagnosis Date  . DM   . HYPERLIPIDEMIA   . HYPERTENSION   . CAD   . CEREBROVASCULAR DISEASE   . AAA     History reviewed. No  pertinent past surgical history.  History   Social History  . Marital Status: Married    Spouse Name: N/A    Number of Children: N/A  . Years of Education: N/A   Occupational History  . Not on file.   Social History Main Topics  . Smoking status: Former Research scientist (life sciences)  . Smokeless tobacco: Not on file  . Alcohol Use: Yes  . Drug Use: No  . Sexual Activity: Not on file   Other Topics Concern  . Not on file   Social History Narrative  . No narrative on file    ROS: no fevers or chills, productive cough, hemoptysis, dysphasia, odynophagia, melena, hematochezia, dysuria, hematuria, rash, seizure activity, orthopnea, PND, pedal edema, claudication. Remaining systems are negative.  Physical Exam: Well-developed well-nourished in no acute distress.  Skin is warm and dry.  HEENT is normal.  Neck is supple.  Chest is clear to auscultation with normal expansion.  Cardiovascular exam is regular rate and rhythm.  Abdominal exam nontender or distended. No masses palpated. Extremities show no edema. neuro grossly intact  ECG Sinus rhythm at a rate of 67. No ST changes.

## 2014-01-29 NOTE — Assessment & Plan Note (Signed)
Blood pressure controlled. Continue present medications. 

## 2015-02-09 NOTE — Progress Notes (Signed)
      HPI: FU CAD; history of PCI of his right coronary artery in June 2000. His last carotid Dopplers in April 2011 showed 0-39% bilateral stenosis. His most recent abdominal ultrasound in October of 2013 showed mild fusiform dilatation but no frank aneurysm. Nuclear study May 2014 showed an ejection fraction of 59%. There was soft tissue attenuation but no ischemia. Since I last saw him, the patient has dyspnea with more extreme activities but not with routine activities. It is relieved with rest. It is not associated with chest pain. There is no orthopnea, PND or pedal edema. There is no syncope or palpitations. There is no exertional chest pain.   Current Outpatient Prescriptions  Medication Sig Dispense Refill  . amLODipine (NORVASC) 5 MG tablet Take 1 tablet (5 mg total) by mouth daily. 30 tablet 12  . aspirin 325 MG tablet Take 1/2 tablet daily    . atorvastatin (LIPITOR) 80 MG tablet Take 80 mg by mouth daily.    . betamethasone valerate (VALISONE) 0.1 % cream Apply topically 2 (two) times daily.    . carvedilol (COREG) 25 MG tablet Take 12.5 mg by mouth 2 (two) times daily with a meal.     . glimepiride (AMARYL) 4 MG tablet Take 6 mg by mouth daily. Take 1 1/2 tablet daily    . metFORMIN (GLUCOPHAGE) 1000 MG tablet Take 1,000 mg by mouth 2 (two) times daily with a meal.    . nitroGLYCERIN (NITROSTAT) 0.4 MG SL tablet Place 1 tablet (0.4 mg total) under the tongue every 5 (five) minutes as needed for chest pain. 25 tablet 12  . pioglitazone (ACTOS) 45 MG tablet Take one daily     No current facility-administered medications for this visit.     Past Medical History  Diagnosis Date  . DM   . HYPERLIPIDEMIA   . HYPERTENSION   . CAD   . CEREBROVASCULAR DISEASE   . AAA     History reviewed. No pertinent past surgical history.  History   Social History  . Marital Status: Married    Spouse Name: N/A  . Number of Children: N/A  . Years of Education: N/A   Occupational  History  . Not on file.   Social History Main Topics  . Smoking status: Former Research scientist (life sciences)  . Smokeless tobacco: Not on file  . Alcohol Use: Yes  . Drug Use: No  . Sexual Activity: Not on file   Other Topics Concern  . Not on file   Social History Narrative    ROS: no fevers or chills, productive cough, hemoptysis, dysphasia, odynophagia, melena, hematochezia, dysuria, hematuria, rash, seizure activity, orthopnea, PND, pedal edema, claudication. Remaining systems are negative.  Physical Exam: Well-developed well-nourished in no acute distress.  Skin is warm and dry.  HEENT is normal.  Neck is supple.  Chest is clear to auscultation with normal expansion.  Cardiovascular exam is regular rate and rhythm.  Abdominal exam nontender or distended. No masses palpated. Extremities show no edema. neuro grossly intact  ECG sinus rhythm at a rate of 63. Left axis deviation.

## 2015-02-12 ENCOUNTER — Encounter: Payer: Self-pay | Admitting: Cardiology

## 2015-02-12 ENCOUNTER — Ambulatory Visit (INDEPENDENT_AMBULATORY_CARE_PROVIDER_SITE_OTHER): Payer: Medicare Other | Admitting: Cardiology

## 2015-02-12 VITALS — BP 130/70 | HR 63 | Ht 70.5 in | Wt 240.5 lb

## 2015-02-12 DIAGNOSIS — I714 Abdominal aortic aneurysm, without rupture, unspecified: Secondary | ICD-10-CM

## 2015-02-12 DIAGNOSIS — I10 Essential (primary) hypertension: Secondary | ICD-10-CM | POA: Diagnosis not present

## 2015-02-12 NOTE — Assessment & Plan Note (Signed)
Continue aspirin and statin. 

## 2015-02-12 NOTE — Assessment & Plan Note (Signed)
Continue statin. Lipids and liver monitored by primary care. 

## 2015-02-12 NOTE — Assessment & Plan Note (Signed)
Blood pressure controlled. Continue present medications. Potassium and renal function monitored by primary care. 

## 2015-02-12 NOTE — Patient Instructions (Signed)
Your physician wants you to follow-up in: 1 Year You will receive a reminder letter in the mail two months in advance. If you don't receive a letter, please call our office to schedule the follow-up appointment.  Your physician has requested that you have an abdominal aorta duplex. During this test, an ultrasound is used to evaluate the aorta. Allow 30 minutes for this exam. Do not eat after midnight the day before and avoid carbonated beverages

## 2015-02-12 NOTE — Assessment & Plan Note (Signed)
Schedule followup abdominal ultrasound. 

## 2015-02-17 ENCOUNTER — Ambulatory Visit (HOSPITAL_COMMUNITY)
Admission: RE | Admit: 2015-02-17 | Discharge: 2015-02-17 | Disposition: A | Discharge status: Home / Self Care | Payer: Medicare Other | Source: Ambulatory Visit | Attending: Cardiovascular Disease | Admitting: Cardiovascular Disease

## 2015-02-17 DIAGNOSIS — I714 Abdominal aortic aneurysm, without rupture, unspecified: Secondary | ICD-10-CM

## 2016-02-10 NOTE — Progress Notes (Signed)
      HPI: FU CAD; history of PCI of his right coronary artery in June 2000. His last carotid Dopplers in April 2011 showed 0-39% bilateral stenosis. Nuclear study May 2014 showed an ejection fraction of 59%. There was soft tissue attenuation but no ischemia. His most recent abdominal ultrasound in May 2016 showed mild fusiform dilatation but no frank aneurysm (3.1 x 2.9 cm). Since I last saw him, the patient denies any dyspnea on exertion, orthopnea, PND, pedal edema, palpitations, syncope or chest pain.   Current Outpatient Prescriptions  Medication Sig Dispense Refill  . amLODipine (NORVASC) 5 MG tablet Take 1 tablet (5 mg total) by mouth daily. 30 tablet 12  . aspirin 81 MG tablet Take 81 mg by mouth daily.    Marland Kitchen atorvastatin (LIPITOR) 80 MG tablet Take 80 mg by mouth daily.    . betamethasone valerate (VALISONE) 0.1 % cream Apply topically 2 (two) times daily.    . carvedilol (COREG) 25 MG tablet Take 12.5 mg by mouth 2 (two) times daily with a meal.     . cholecalciferol (VITAMIN D) 1000 units tablet Take 1,000 Units by mouth daily.    Marland Kitchen glimepiride (AMARYL) 4 MG tablet Take 6 mg by mouth daily. Take 1 1/2 tablet daily    . hydrochlorothiazide (MICROZIDE) 12.5 MG capsule Take 12.5 mg by mouth daily.    . metFORMIN (GLUCOPHAGE) 1000 MG tablet Take 1,000 mg by mouth 2 (two) times daily with a meal.    . pioglitazone (ACTOS) 45 MG tablet Take one daily    . nitroGLYCERIN (NITROSTAT) 0.4 MG SL tablet Place 1 tablet (0.4 mg total) under the tongue every 5 (five) minutes as needed for chest pain. 25 tablet 12   No current facility-administered medications for this visit.     Past Medical History  Diagnosis Date  . DM   . HYPERLIPIDEMIA   . HYPERTENSION   . CAD   . CEREBROVASCULAR DISEASE   . AAA     History reviewed. No pertinent past surgical history.  Social History   Social History  . Marital Status: Married    Spouse Name: N/A  . Number of Children: N/A  . Years of  Education: N/A   Occupational History  . Not on file.   Social History Main Topics  . Smoking status: Former Research scientist (life sciences)  . Smokeless tobacco: Not on file  . Alcohol Use: Yes  . Drug Use: No  . Sexual Activity: Not on file   Other Topics Concern  . Not on file   Social History Narrative    History reviewed. No pertinent family history.  ROS: fatigue but no fevers or chills, productive cough, hemoptysis, dysphasia, odynophagia, melena, hematochezia, dysuria, hematuria, rash, seizure activity, orthopnea, PND, pedal edema, claudication. Remaining systems are negative.  Physical Exam: Well-developed well-nourished in no acute distress.  Skin is warm and dry.  HEENT is normal.  Neck is supple.  Chest is clear to auscultation with normal expansion.  Cardiovascular exam is regular rate and rhythm.  Abdominal exam nontender or distended. No masses palpated. Extremities show no edema. neuro grossly intact  ECG Sinus rhythm at a rate of 65. No ST changes.

## 2016-02-14 ENCOUNTER — Ambulatory Visit (INDEPENDENT_AMBULATORY_CARE_PROVIDER_SITE_OTHER): Payer: PPO | Admitting: Cardiology

## 2016-02-14 ENCOUNTER — Encounter: Payer: Self-pay | Admitting: Cardiology

## 2016-02-14 VITALS — BP 130/80 | HR 65 | Ht 70.0 in | Wt 238.4 lb

## 2016-02-14 DIAGNOSIS — I10 Essential (primary) hypertension: Secondary | ICD-10-CM

## 2016-02-14 DIAGNOSIS — I714 Abdominal aortic aneurysm, without rupture, unspecified: Secondary | ICD-10-CM

## 2016-02-14 NOTE — Assessment & Plan Note (Addendum)
Continue statin. Lipids and liver monitored by primary care. 

## 2016-02-14 NOTE — Patient Instructions (Signed)
Medication Instructions:  Your physician recommends that you continue on your current medications as directed. Please refer to the Current Medication list given to you today.   Labwork: None ordered  Testing/Procedures: None ordered  Follow-Up: Your physician wants you to follow-up in: 1 year with Dr.Crenshaw You will receive a reminder letter in the mail two months in advance. If you don't receive a letter, please call our office to schedule the follow-up appointment.   Any Other Special Instructions Will Be Listed Below (If Applicable).     If you need a refill on your cardiac medications before your next appointment, please call your pharmacy.

## 2016-02-14 NOTE — Assessment & Plan Note (Signed)
Blood pressure controlled. Continue present medications. 

## 2016-02-14 NOTE — Assessment & Plan Note (Signed)
Continue aspirin and statin. 

## 2016-02-14 NOTE — Assessment & Plan Note (Signed)
Plan follow-up abdominal ultrasoundMay 2018.

## 2016-02-16 DIAGNOSIS — E119 Type 2 diabetes mellitus without complications: Secondary | ICD-10-CM | POA: Diagnosis not present

## 2016-02-16 DIAGNOSIS — H2513 Age-related nuclear cataract, bilateral: Secondary | ICD-10-CM | POA: Diagnosis not present

## 2016-05-22 DIAGNOSIS — I10 Essential (primary) hypertension: Secondary | ICD-10-CM | POA: Diagnosis not present

## 2016-05-22 DIAGNOSIS — E119 Type 2 diabetes mellitus without complications: Secondary | ICD-10-CM | POA: Diagnosis not present

## 2016-05-22 DIAGNOSIS — I714 Abdominal aortic aneurysm, without rupture: Secondary | ICD-10-CM | POA: Diagnosis not present

## 2016-05-22 DIAGNOSIS — I2510 Atherosclerotic heart disease of native coronary artery without angina pectoris: Secondary | ICD-10-CM | POA: Diagnosis not present

## 2016-05-22 DIAGNOSIS — E785 Hyperlipidemia, unspecified: Secondary | ICD-10-CM | POA: Diagnosis not present

## 2016-05-22 DIAGNOSIS — Z0000 Encounter for general adult medical examination without abnormal findings: Secondary | ICD-10-CM | POA: Diagnosis not present

## 2016-05-22 DIAGNOSIS — D649 Anemia, unspecified: Secondary | ICD-10-CM | POA: Diagnosis not present

## 2016-06-09 DIAGNOSIS — D123 Benign neoplasm of transverse colon: Secondary | ICD-10-CM | POA: Diagnosis not present

## 2016-06-09 DIAGNOSIS — K5520 Angiodysplasia of colon without hemorrhage: Secondary | ICD-10-CM | POA: Diagnosis not present

## 2016-06-09 DIAGNOSIS — Z1211 Encounter for screening for malignant neoplasm of colon: Secondary | ICD-10-CM | POA: Diagnosis not present

## 2016-06-09 DIAGNOSIS — Z800 Family history of malignant neoplasm of digestive organs: Secondary | ICD-10-CM | POA: Diagnosis not present

## 2016-06-09 DIAGNOSIS — K621 Rectal polyp: Secondary | ICD-10-CM | POA: Diagnosis not present

## 2016-06-09 DIAGNOSIS — D128 Benign neoplasm of rectum: Secondary | ICD-10-CM | POA: Diagnosis not present

## 2016-06-09 DIAGNOSIS — K635 Polyp of colon: Secondary | ICD-10-CM | POA: Diagnosis not present

## 2016-06-09 DIAGNOSIS — Z86010 Personal history of colonic polyps: Secondary | ICD-10-CM | POA: Diagnosis not present

## 2016-06-09 DIAGNOSIS — D122 Benign neoplasm of ascending colon: Secondary | ICD-10-CM | POA: Diagnosis not present

## 2016-06-21 DIAGNOSIS — M47816 Spondylosis without myelopathy or radiculopathy, lumbar region: Secondary | ICD-10-CM | POA: Diagnosis not present

## 2016-06-21 DIAGNOSIS — M5417 Radiculopathy, lumbosacral region: Secondary | ICD-10-CM | POA: Diagnosis not present

## 2016-07-20 DIAGNOSIS — M47897 Other spondylosis, lumbosacral region: Secondary | ICD-10-CM | POA: Diagnosis not present

## 2016-07-20 DIAGNOSIS — M48061 Spinal stenosis, lumbar region without neurogenic claudication: Secondary | ICD-10-CM | POA: Diagnosis not present

## 2016-07-20 DIAGNOSIS — M5126 Other intervertebral disc displacement, lumbar region: Secondary | ICD-10-CM | POA: Diagnosis not present

## 2016-07-20 DIAGNOSIS — M5417 Radiculopathy, lumbosacral region: Secondary | ICD-10-CM | POA: Diagnosis not present

## 2016-08-24 DIAGNOSIS — M5137 Other intervertebral disc degeneration, lumbosacral region: Secondary | ICD-10-CM | POA: Diagnosis not present

## 2016-08-24 DIAGNOSIS — M5416 Radiculopathy, lumbar region: Secondary | ICD-10-CM | POA: Diagnosis not present

## 2016-08-24 DIAGNOSIS — M5126 Other intervertebral disc displacement, lumbar region: Secondary | ICD-10-CM | POA: Diagnosis not present

## 2016-10-19 DIAGNOSIS — M5137 Other intervertebral disc degeneration, lumbosacral region: Secondary | ICD-10-CM | POA: Diagnosis not present

## 2017-09-12 DIAGNOSIS — Z125 Encounter for screening for malignant neoplasm of prostate: Secondary | ICD-10-CM | POA: Diagnosis not present

## 2017-09-12 DIAGNOSIS — Z23 Encounter for immunization: Secondary | ICD-10-CM | POA: Diagnosis not present

## 2017-09-12 DIAGNOSIS — D649 Anemia, unspecified: Secondary | ICD-10-CM | POA: Diagnosis not present

## 2017-09-12 DIAGNOSIS — E119 Type 2 diabetes mellitus without complications: Secondary | ICD-10-CM | POA: Diagnosis not present

## 2017-09-12 DIAGNOSIS — Z0000 Encounter for general adult medical examination without abnormal findings: Secondary | ICD-10-CM | POA: Diagnosis not present

## 2017-09-12 DIAGNOSIS — I10 Essential (primary) hypertension: Secondary | ICD-10-CM | POA: Diagnosis not present

## 2017-09-12 DIAGNOSIS — E785 Hyperlipidemia, unspecified: Secondary | ICD-10-CM | POA: Diagnosis not present

## 2017-09-12 DIAGNOSIS — M5416 Radiculopathy, lumbar region: Secondary | ICD-10-CM | POA: Diagnosis not present

## 2018-03-19 DIAGNOSIS — E119 Type 2 diabetes mellitus without complications: Secondary | ICD-10-CM | POA: Diagnosis not present

## 2018-03-19 DIAGNOSIS — H2513 Age-related nuclear cataract, bilateral: Secondary | ICD-10-CM | POA: Diagnosis not present

## 2018-04-01 DIAGNOSIS — E785 Hyperlipidemia, unspecified: Secondary | ICD-10-CM | POA: Diagnosis not present

## 2018-04-01 DIAGNOSIS — E119 Type 2 diabetes mellitus without complications: Secondary | ICD-10-CM | POA: Diagnosis not present

## 2018-04-01 DIAGNOSIS — I10 Essential (primary) hypertension: Secondary | ICD-10-CM | POA: Diagnosis not present

## 2018-04-03 DIAGNOSIS — I10 Essential (primary) hypertension: Secondary | ICD-10-CM | POA: Diagnosis not present

## 2018-04-03 DIAGNOSIS — I2510 Atherosclerotic heart disease of native coronary artery without angina pectoris: Secondary | ICD-10-CM | POA: Diagnosis not present

## 2018-04-03 DIAGNOSIS — E119 Type 2 diabetes mellitus without complications: Secondary | ICD-10-CM | POA: Diagnosis not present

## 2018-04-03 DIAGNOSIS — I679 Cerebrovascular disease, unspecified: Secondary | ICD-10-CM | POA: Diagnosis not present

## 2018-04-03 DIAGNOSIS — Z0000 Encounter for general adult medical examination without abnormal findings: Secondary | ICD-10-CM | POA: Diagnosis not present

## 2018-06-23 NOTE — Progress Notes (Signed)
HPI: FU CAD; history of PCI of his right coronary artery in June 2000. His last carotid Dopplers in April 2011 showed 0-39% bilateral stenosis. Nuclear study May 2014 showed an ejection fraction of 59%. There was soft tissue attenuation but no ischemia. His most recent abdominal ultrasound in May 2016 showed mild fusiform dilatation but no frank aneurysm (3.1 x 2.9 cm). Since I last saw him, the patient denies any dyspnea on exertion, orthopnea, PND, pedal edema, palpitations, syncope or chest pain.   Current Outpatient Medications  Medication Sig Dispense Refill  . amLODipine (NORVASC) 5 MG tablet Take 1 tablet (5 mg total) by mouth daily. 30 tablet 12  . aspirin 81 MG tablet Take 81 mg by mouth daily.    Marland Kitchen atorvastatin (LIPITOR) 80 MG tablet Take 80 mg by mouth daily.    . betamethasone valerate (VALISONE) 0.1 % cream Apply topically 2 (two) times daily.    . carvedilol (COREG) 25 MG tablet Take 12.5 mg by mouth 2 (two) times daily with a meal.     . cholecalciferol (VITAMIN D) 1000 units tablet Take 1,000 Units by mouth daily.    Marland Kitchen glimepiride (AMARYL) 4 MG tablet Take 6 mg by mouth daily. Take 1 1/2 tablet daily    . hydrochlorothiazide (MICROZIDE) 12.5 MG capsule Take 12.5 mg by mouth daily.    . metFORMIN (GLUCOPHAGE) 1000 MG tablet Take 1,000 mg by mouth 2 (two) times daily with a meal.    . nitroGLYCERIN (NITROSTAT) 0.4 MG SL tablet Place 1 tablet (0.4 mg total) under the tongue every 5 (five) minutes as needed for chest pain. 25 tablet 12  . pioglitazone (ACTOS) 45 MG tablet Take one daily    . gabapentin (NEURONTIN) 600 MG tablet 3 (three) times daily.  3   No current facility-administered medications for this visit.      Past Medical History:  Diagnosis Date  . AAA   . CAD   . CEREBROVASCULAR DISEASE   . DM   . HYPERLIPIDEMIA   . HYPERTENSION     History reviewed. No pertinent surgical history.  Social History   Socioeconomic History  . Marital status:  Married    Spouse name: Not on file  . Number of children: Not on file  . Years of education: Not on file  . Highest education level: Not on file  Occupational History  . Not on file  Social Needs  . Financial resource strain: Not on file  . Food insecurity:    Worry: Not on file    Inability: Not on file  . Transportation needs:    Medical: Not on file    Non-medical: Not on file  Tobacco Use  . Smoking status: Former Research scientist (life sciences)  . Smokeless tobacco: Never Used  Substance and Sexual Activity  . Alcohol use: Yes  . Drug use: No  . Sexual activity: Not on file  Lifestyle  . Physical activity:    Days per week: Not on file    Minutes per session: Not on file  . Stress: Not on file  Relationships  . Social connections:    Talks on phone: Not on file    Gets together: Not on file    Attends religious service: Not on file    Active member of club or organization: Not on file    Attends meetings of clubs or organizations: Not on file    Relationship status: Not on file  . Intimate partner violence:  Fear of current or ex partner: Not on file    Emotionally abused: Not on file    Physically abused: Not on file    Forced sexual activity: Not on file  Other Topics Concern  . Not on file  Social History Narrative  . Not on file    History reviewed. No pertinent family history.  ROS: Back pain but no fevers or chills, productive cough, hemoptysis, dysphasia, odynophagia, melena, hematochezia, dysuria, hematuria, rash, seizure activity, orthopnea, PND, pedal edema, claudication. Remaining systems are negative.  Physical Exam: Well-developed well-nourished in no acute distress.  Skin is warm and dry.  HEENT is normal.  Neck is supple.  Chest is clear to auscultation with normal expansion.  Cardiovascular exam is regular rate and rhythm.  Abdominal exam nontender or distended. No masses palpated. Extremities show no edema. neuro grossly intact  ECG-sinus rhythm at a rate  of 69.  No ST changes.  Personally reviewed  A/P  1 coronary artery disease-patient denies chest pain.  Plan to continue medical therapy with aspirin and statin.  2 abdominal aortic aneurysm-we will plan to repeat abdominal ultrasound.  3 hypertension-patient's blood pressure is controlled.  Continue present medications and follow.  4 hyperlipidemia-continue statin.  Lipids and liver monitored by primary care.  5 preoperative evaluation prior to cataract surgery-procedure is low risk.  He has good functional capacity with no dyspnea or chest pain.  His main limitation is from back pain.  He may proceed with surgery without further cardiac evaluation.  Kirk Ruths, MD

## 2018-06-28 ENCOUNTER — Ambulatory Visit (INDEPENDENT_AMBULATORY_CARE_PROVIDER_SITE_OTHER): Payer: PPO | Admitting: Cardiology

## 2018-06-28 ENCOUNTER — Encounter: Payer: Self-pay | Admitting: Cardiology

## 2018-06-28 VITALS — BP 131/82 | HR 69 | Ht 70.0 in | Wt 240.8 lb

## 2018-06-28 DIAGNOSIS — I714 Abdominal aortic aneurysm, without rupture, unspecified: Secondary | ICD-10-CM

## 2018-06-28 DIAGNOSIS — I2510 Atherosclerotic heart disease of native coronary artery without angina pectoris: Secondary | ICD-10-CM

## 2018-06-28 DIAGNOSIS — I10 Essential (primary) hypertension: Secondary | ICD-10-CM

## 2018-06-28 DIAGNOSIS — E7800 Pure hypercholesterolemia, unspecified: Secondary | ICD-10-CM | POA: Diagnosis not present

## 2018-06-28 NOTE — Patient Instructions (Signed)
Medication Instructions:  NO CHANGE If you need a refill on your cardiac medications before your next appointment, please call your pharmacy.   Lab work: If you have labs (blood work) drawn today and your tests are completely normal, you will receive your results only by: Marland Kitchen MyChart Message (if you have MyChart) OR . A paper copy in the mail If you have any lab test that is abnormal or we need to change your treatment, we will call you to review the results.  Testing/Procedures: Your physician has requested that you have an abdominal aorta duplex. During this test, an ultrasound is used to evaluate the aorta. Allow 30 minutes for this exam. Do not eat after midnight the day before and avoid carbonated beverages   Follow-Up: At [facility], you and your health needs are our priority.  As part of our continuing mission to provide you with exceptional heart care, we have created designated Provider Care Teams.  These Care Teams include your primary Cardiologist (physician) and Advanced Practice Providers (APPs -  Physician Assistants and Nurse Practitioners) who all work together to provide you with the care you need, when you need it. You will need a follow up appointment in 12 months.  Please call our office 2 months in advance to schedule this appointment.  You may see Dr Stanford Breed  or one of the following Advanced Practice Providers on your designated Care Team:   Kerin Ransom, PA-C [place], [place] . Sande Rives, PA-C    1

## 2018-07-04 ENCOUNTER — Ambulatory Visit (HOSPITAL_COMMUNITY)
Admission: RE | Admit: 2018-07-04 | Discharge: 2018-07-04 | Disposition: A | Discharge status: Home / Self Care | Payer: PPO | Source: Ambulatory Visit | Attending: Internal Medicine | Admitting: Internal Medicine

## 2018-07-04 DIAGNOSIS — I714 Abdominal aortic aneurysm, without rupture, unspecified: Secondary | ICD-10-CM

## 2018-07-05 ENCOUNTER — Other Ambulatory Visit: Payer: Self-pay | Admitting: *Deleted

## 2018-07-05 DIAGNOSIS — I714 Abdominal aortic aneurysm, without rupture, unspecified: Secondary | ICD-10-CM

## 2018-08-16 DIAGNOSIS — H25811 Combined forms of age-related cataract, right eye: Secondary | ICD-10-CM | POA: Diagnosis not present

## 2018-08-30 DIAGNOSIS — H25811 Combined forms of age-related cataract, right eye: Secondary | ICD-10-CM | POA: Diagnosis not present

## 2018-08-30 DIAGNOSIS — Z01818 Encounter for other preprocedural examination: Secondary | ICD-10-CM | POA: Diagnosis not present

## 2018-09-24 DIAGNOSIS — E1136 Type 2 diabetes mellitus with diabetic cataract: Secondary | ICD-10-CM | POA: Diagnosis not present

## 2018-09-24 DIAGNOSIS — Z79899 Other long term (current) drug therapy: Secondary | ICD-10-CM | POA: Diagnosis not present

## 2018-09-24 DIAGNOSIS — I252 Old myocardial infarction: Secondary | ICD-10-CM | POA: Diagnosis not present

## 2018-09-24 DIAGNOSIS — Z7984 Long term (current) use of oral hypoglycemic drugs: Secondary | ICD-10-CM | POA: Diagnosis not present

## 2018-09-24 DIAGNOSIS — I10 Essential (primary) hypertension: Secondary | ICD-10-CM | POA: Diagnosis not present

## 2018-09-24 DIAGNOSIS — H25812 Combined forms of age-related cataract, left eye: Secondary | ICD-10-CM | POA: Diagnosis not present

## 2018-09-24 DIAGNOSIS — Z955 Presence of coronary angioplasty implant and graft: Secondary | ICD-10-CM | POA: Diagnosis not present

## 2018-09-24 DIAGNOSIS — M1990 Unspecified osteoarthritis, unspecified site: Secondary | ICD-10-CM | POA: Diagnosis not present

## 2018-09-24 DIAGNOSIS — E7800 Pure hypercholesterolemia, unspecified: Secondary | ICD-10-CM | POA: Diagnosis not present

## 2018-09-24 DIAGNOSIS — Z7982 Long term (current) use of aspirin: Secondary | ICD-10-CM | POA: Diagnosis not present

## 2018-09-24 DIAGNOSIS — K219 Gastro-esophageal reflux disease without esophagitis: Secondary | ICD-10-CM | POA: Diagnosis not present

## 2018-09-24 DIAGNOSIS — H25811 Combined forms of age-related cataract, right eye: Secondary | ICD-10-CM | POA: Diagnosis not present

## 2018-09-24 DIAGNOSIS — H52223 Regular astigmatism, bilateral: Secondary | ICD-10-CM | POA: Diagnosis not present

## 2018-09-24 DIAGNOSIS — H259 Unspecified age-related cataract: Secondary | ICD-10-CM | POA: Diagnosis not present

## 2018-09-24 DIAGNOSIS — E119 Type 2 diabetes mellitus without complications: Secondary | ICD-10-CM | POA: Diagnosis not present

## 2018-10-02 DIAGNOSIS — H65193 Other acute nonsuppurative otitis media, bilateral: Secondary | ICD-10-CM | POA: Diagnosis not present

## 2018-10-02 DIAGNOSIS — J988 Other specified respiratory disorders: Secondary | ICD-10-CM | POA: Diagnosis not present

## 2018-10-22 DIAGNOSIS — H52223 Regular astigmatism, bilateral: Secondary | ICD-10-CM | POA: Diagnosis not present

## 2018-10-22 DIAGNOSIS — M1990 Unspecified osteoarthritis, unspecified site: Secondary | ICD-10-CM | POA: Diagnosis not present

## 2018-10-22 DIAGNOSIS — E1136 Type 2 diabetes mellitus with diabetic cataract: Secondary | ICD-10-CM | POA: Diagnosis not present

## 2018-10-22 DIAGNOSIS — I252 Old myocardial infarction: Secondary | ICD-10-CM | POA: Diagnosis not present

## 2018-10-22 DIAGNOSIS — E7800 Pure hypercholesterolemia, unspecified: Secondary | ICD-10-CM | POA: Diagnosis not present

## 2018-10-22 DIAGNOSIS — I10 Essential (primary) hypertension: Secondary | ICD-10-CM | POA: Diagnosis not present

## 2018-10-22 DIAGNOSIS — Z79899 Other long term (current) drug therapy: Secondary | ICD-10-CM | POA: Diagnosis not present

## 2018-10-22 DIAGNOSIS — H25812 Combined forms of age-related cataract, left eye: Secondary | ICD-10-CM | POA: Diagnosis not present

## 2018-10-22 DIAGNOSIS — Z7982 Long term (current) use of aspirin: Secondary | ICD-10-CM | POA: Diagnosis not present

## 2018-10-22 DIAGNOSIS — K219 Gastro-esophageal reflux disease without esophagitis: Secondary | ICD-10-CM | POA: Diagnosis not present

## 2018-10-22 DIAGNOSIS — Z7984 Long term (current) use of oral hypoglycemic drugs: Secondary | ICD-10-CM | POA: Diagnosis not present

## 2018-10-22 DIAGNOSIS — H259 Unspecified age-related cataract: Secondary | ICD-10-CM | POA: Diagnosis not present

## 2018-10-22 DIAGNOSIS — Z955 Presence of coronary angioplasty implant and graft: Secondary | ICD-10-CM | POA: Diagnosis not present

## 2018-11-19 DIAGNOSIS — E119 Type 2 diabetes mellitus without complications: Secondary | ICD-10-CM | POA: Diagnosis not present

## 2018-11-19 DIAGNOSIS — Z125 Encounter for screening for malignant neoplasm of prostate: Secondary | ICD-10-CM | POA: Diagnosis not present

## 2018-11-21 DIAGNOSIS — I10 Essential (primary) hypertension: Secondary | ICD-10-CM | POA: Diagnosis not present

## 2018-11-21 DIAGNOSIS — I2510 Atherosclerotic heart disease of native coronary artery without angina pectoris: Secondary | ICD-10-CM | POA: Diagnosis not present

## 2018-11-21 DIAGNOSIS — H6191 Disorder of right external ear, unspecified: Secondary | ICD-10-CM | POA: Diagnosis not present

## 2018-11-21 DIAGNOSIS — M5416 Radiculopathy, lumbar region: Secondary | ICD-10-CM | POA: Diagnosis not present

## 2018-11-21 DIAGNOSIS — E785 Hyperlipidemia, unspecified: Secondary | ICD-10-CM | POA: Diagnosis not present

## 2018-11-21 DIAGNOSIS — Z87891 Personal history of nicotine dependence: Secondary | ICD-10-CM | POA: Diagnosis not present

## 2018-11-21 DIAGNOSIS — E119 Type 2 diabetes mellitus without complications: Secondary | ICD-10-CM | POA: Diagnosis not present

## 2018-11-28 DIAGNOSIS — H6191 Disorder of right external ear, unspecified: Secondary | ICD-10-CM | POA: Diagnosis not present

## 2019-05-13 DIAGNOSIS — E119 Type 2 diabetes mellitus without complications: Secondary | ICD-10-CM | POA: Diagnosis not present

## 2019-05-13 DIAGNOSIS — E785 Hyperlipidemia, unspecified: Secondary | ICD-10-CM | POA: Diagnosis not present

## 2019-05-13 DIAGNOSIS — I10 Essential (primary) hypertension: Secondary | ICD-10-CM | POA: Diagnosis not present

## 2019-05-16 DIAGNOSIS — E785 Hyperlipidemia, unspecified: Secondary | ICD-10-CM | POA: Diagnosis not present

## 2019-05-16 DIAGNOSIS — E119 Type 2 diabetes mellitus without complications: Secondary | ICD-10-CM | POA: Diagnosis not present

## 2019-06-08 DIAGNOSIS — R269 Unspecified abnormalities of gait and mobility: Secondary | ICD-10-CM | POA: Diagnosis not present

## 2019-06-25 NOTE — Progress Notes (Signed)
Virtual Visit via Video Note changed to phone visit at pt request   This visit type was conducted due to national recommendations for restrictions regarding the COVID-19 Pandemic (e.g. social distancing) in an effort to limit this patient's exposure and mitigate transmission in our community.  Due to his co-morbid illnesses, this patient is at least at moderate risk for complications without adequate follow up.  This format is felt to be most appropriate for this patient at this time.  All issues noted in this document were discussed and addressed.  A limited physical exam was performed with this format.  Please refer to the patient's chart for his consent to telehealth for [facility].   Date:  06/26/2019   ID:  Jerry Dike Sr., DOB Nov 04, 1945, MRN [id number]  Patient Location:Home Provider Location: Home  PCP:  Maryruth Hancock, MD  Cardiologist:  Dr Stanford Breed  Evaluation Performed:  Follow-Up Visit  Chief Complaint:  FU CAD  History of Present Illness:    FU CAD; history of PCI of his right coronary artery in June 2000. His last carotid Dopplers in April 2011 showed 0-39% bilateral stenosis. Nuclear study May 2014 showed an ejection fraction of 59%. There was soft tissue attenuation but no ischemia.  Abdominal ultrasound October 2019 showed largest measurement being 2.8 cm and there was note of dilatation of the distal aorta measuring 2.3 x 2.8 cm. Since I last saw him, pt denies CP, dyspnea or syncope.  The patient does not have symptoms concerning for COVID-19 infection (fever, chills, cough, or new shortness of breath).    Past Medical History:  Diagnosis Date  . AAA   . CAD   . CEREBROVASCULAR DISEASE   . DM   . HYPERLIPIDEMIA   . HYPERTENSION    No past surgical history on file.   Current Meds  Medication Sig  . amLODipine (NORVASC) 5 MG tablet Take 1 tablet (5 mg total) by mouth daily.  Marland Kitchen aspirin 81 MG tablet Take 81 mg by mouth daily.  Marland Kitchen atorvastatin (LIPITOR)  80 MG tablet Take 80 mg by mouth daily.  . betamethasone valerate (VALISONE) 0.1 % cream Apply topically 2 (two) times daily.  . carvedilol (COREG) 25 MG tablet Take 12.5 mg by mouth 2 (two) times daily with a meal.   . cholecalciferol (VITAMIN D) 1000 units tablet Take 1,000 Units by mouth daily.  Marland Kitchen gabapentin (NEURONTIN) 600 MG tablet 3 (three) times daily.  Marland Kitchen glimepiride (AMARYL) 4 MG tablet Take 6 mg by mouth daily. Take 1 1/2 tablet daily  . hydrochlorothiazide (MICROZIDE) 12.5 MG capsule Take 12.5 mg by mouth daily.  . metFORMIN (GLUCOPHAGE) 1000 MG tablet Take 1,000 mg by mouth 2 (two) times daily with a meal.  . nitroGLYCERIN (NITROSTAT) 0.4 MG SL tablet Place 1 tablet (0.4 mg total) under the tongue every 5 (five) minutes as needed for chest pain.  . pioglitazone (ACTOS) 45 MG tablet Take one daily     Allergies:   Patient has no known allergies.   Social History   Tobacco Use  . Smoking status: Former Research scientist (life sciences)  . Smokeless tobacco: Never Used  Substance Use Topics  . Alcohol use: Yes  . Drug use: No     Family Hx: The patient's family history is not on file.  ROS:   Please see the history of present illness.    No Fever, chills  or productive cough; recent balance problems now improved. All other systems reviewed and are negative.  Wt Readings from Last 3 Encounters:  06/26/19 244 lb (110.7 kg)  06/28/18 240 lb 12.8 oz (109.2 kg)  02/14/16 238 lb 6.4 oz (108.1 kg)     Objective:    Vital Signs:  BP 130/66   Ht 5\' 10"  (1.778 m)   Wt 244 lb (110.7 kg)   BMI 35.01 kg/m    VITAL SIGNS:  reviewed NAD Answers questions appropriately Normal affect Remainder of physical examination not performed (telehealth visit; coronavirus pandemic)  ASSESSMENT & PLAN:    1. Coronary artery disease-patient has not had recurrent chest pain.  Continue medical therapy with aspirin and statin. 2. Hypertension-blood pressure controlled.  Continue present medications.  Potassium  and renal function monitored by primary care. 3. Hyperlipidemia-continue statin.  Lipids and liver monitored by primary care. 4. Abdominal aortic aneurysm-will arrange follow-up abdominal ultrasound.  COVID-19 Education: The importance of social distancing was discussed today.  Time:   Today, I have spent 18 minutes with the patient with telehealth technology discussing the above problems.     Medication Adjustments/Labs and Tests Ordered: Current medicines are reviewed at length with the patient today.  Concerns regarding medicines are outlined above.   Tests Ordered: No orders of the defined types were placed in this encounter.   Medication Changes: No orders of the defined types were placed in this encounter.   Follow Up:  Virtual Visit or In Person in 1 year(s)  Signed, Kirk Ruths, MD  06/26/2019 8:04 AM    [place]

## 2019-06-26 ENCOUNTER — Encounter: Payer: Self-pay | Admitting: Cardiology

## 2019-06-26 ENCOUNTER — Telehealth (INDEPENDENT_AMBULATORY_CARE_PROVIDER_SITE_OTHER): Payer: PPO | Admitting: Cardiology

## 2019-06-26 VITALS — BP 130/66 | Ht 70.0 in | Wt 244.0 lb

## 2019-06-26 DIAGNOSIS — I10 Essential (primary) hypertension: Secondary | ICD-10-CM | POA: Diagnosis not present

## 2019-06-26 DIAGNOSIS — I714 Abdominal aortic aneurysm, without rupture, unspecified: Secondary | ICD-10-CM

## 2019-06-26 DIAGNOSIS — E7800 Pure hypercholesterolemia, unspecified: Secondary | ICD-10-CM

## 2019-06-26 DIAGNOSIS — I2510 Atherosclerotic heart disease of native coronary artery without angina pectoris: Secondary | ICD-10-CM

## 2019-06-26 NOTE — Patient Instructions (Signed)
Medication Instructions:  NO CHANGE If you need a refill on your cardiac medications before your next appointment, please call your pharmacy.   Lab work: If you have labs (blood work) drawn today and your tests are completely normal, you will receive your results only by: . MyChart Message (if you have MyChart) OR . A paper copy in the mail If you have any lab test that is abnormal or we need to change your treatment, we will call you to review the results.  Testing/Procedures: Your physician has requested that you have an abdominal aorta duplex. During this test, an ultrasound is used to evaluate the aorta. Allow 30 minutes for this exam. Do not eat after midnight the day before and avoid carbonated beverages [facility]  Follow-Up: At [facility], you and your health needs are our priority.  As part of our continuing mission to provide you with exceptional heart care, we have created designated Provider Care Teams.  These Care Teams include your primary Cardiologist (physician) and Advanced Practice Providers (APPs -  Physician Assistants and Nurse Practitioners) who all work together to provide you with the care you need, when you need it. You will need a follow up appointment in 12 months.  Please call our office 2 months in advance to schedule this appointment.  You may see BRIAN CRENSHAW MD or one of the following Advanced Practice Providers on your designated Care Team:   Luke Kilroy, PA-C Krista Kroeger, PA-C . Callie Goodrich, PA-C     

## 2019-06-26 NOTE — Addendum Note (Signed)
Addended by: Cristopher Estimable on: 06/26/2019 08:24 AM   Modules accepted: Orders

## 2019-06-26 NOTE — Addendum Note (Signed)
Addended by: Cristopher Estimable on: 06/26/2019 08:22 AM   Modules accepted: Orders

## 2019-07-09 DIAGNOSIS — Z86010 Personal history of colonic polyps: Secondary | ICD-10-CM | POA: Diagnosis not present

## 2019-07-09 DIAGNOSIS — Z800 Family history of malignant neoplasm of digestive organs: Secondary | ICD-10-CM | POA: Diagnosis not present

## 2019-07-09 DIAGNOSIS — K635 Polyp of colon: Secondary | ICD-10-CM | POA: Diagnosis not present

## 2019-07-09 DIAGNOSIS — D123 Benign neoplasm of transverse colon: Secondary | ICD-10-CM | POA: Diagnosis not present

## 2019-07-09 DIAGNOSIS — K5520 Angiodysplasia of colon without hemorrhage: Secondary | ICD-10-CM | POA: Diagnosis not present

## 2019-07-09 DIAGNOSIS — D124 Benign neoplasm of descending colon: Secondary | ICD-10-CM | POA: Diagnosis not present

## 2019-07-09 DIAGNOSIS — Z1211 Encounter for screening for malignant neoplasm of colon: Secondary | ICD-10-CM | POA: Diagnosis not present

## 2019-07-15 ENCOUNTER — Other Ambulatory Visit: Payer: Self-pay

## 2019-07-15 ENCOUNTER — Other Ambulatory Visit (HOSPITAL_COMMUNITY): Payer: Self-pay | Admitting: Cardiology

## 2019-07-15 ENCOUNTER — Encounter: Payer: Self-pay | Admitting: *Deleted

## 2019-07-15 ENCOUNTER — Ambulatory Visit (HOSPITAL_COMMUNITY)
Admission: RE | Admit: 2019-07-15 | Discharge: 2019-07-15 | Disposition: A | Discharge status: Home / Self Care | Payer: PPO | Source: Ambulatory Visit | Attending: Cardiology | Admitting: Cardiology

## 2019-07-15 ENCOUNTER — Other Ambulatory Visit (HOSPITAL_COMMUNITY): Payer: Self-pay | Admitting: Diagnostic Radiology

## 2019-07-15 ENCOUNTER — Other Ambulatory Visit: Payer: Self-pay | Admitting: *Deleted

## 2019-07-15 DIAGNOSIS — I714 Abdominal aortic aneurysm, without rupture, unspecified: Secondary | ICD-10-CM

## 2019-08-25 DIAGNOSIS — E119 Type 2 diabetes mellitus without complications: Secondary | ICD-10-CM | POA: Diagnosis not present

## 2019-11-21 DIAGNOSIS — E785 Hyperlipidemia, unspecified: Secondary | ICD-10-CM | POA: Diagnosis not present

## 2019-11-21 DIAGNOSIS — Z125 Encounter for screening for malignant neoplasm of prostate: Secondary | ICD-10-CM | POA: Diagnosis not present

## 2019-11-21 DIAGNOSIS — E119 Type 2 diabetes mellitus without complications: Secondary | ICD-10-CM | POA: Diagnosis not present

## 2019-11-25 DIAGNOSIS — E119 Type 2 diabetes mellitus without complications: Secondary | ICD-10-CM | POA: Diagnosis not present

## 2019-11-25 DIAGNOSIS — I10 Essential (primary) hypertension: Secondary | ICD-10-CM | POA: Diagnosis not present

## 2019-11-25 DIAGNOSIS — L989 Disorder of the skin and subcutaneous tissue, unspecified: Secondary | ICD-10-CM | POA: Diagnosis not present

## 2019-11-25 DIAGNOSIS — I714 Abdominal aortic aneurysm, without rupture: Secondary | ICD-10-CM | POA: Diagnosis not present

## 2019-11-25 DIAGNOSIS — E785 Hyperlipidemia, unspecified: Secondary | ICD-10-CM | POA: Diagnosis not present

## 2019-12-04 DIAGNOSIS — L219 Seborrheic dermatitis, unspecified: Secondary | ICD-10-CM | POA: Diagnosis not present

## 2019-12-04 DIAGNOSIS — L853 Xerosis cutis: Secondary | ICD-10-CM | POA: Diagnosis not present

## 2020-03-02 DIAGNOSIS — G8929 Other chronic pain: Secondary | ICD-10-CM | POA: Diagnosis not present

## 2020-03-02 DIAGNOSIS — M545 Low back pain: Secondary | ICD-10-CM | POA: Diagnosis not present

## 2020-03-30 DIAGNOSIS — M5416 Radiculopathy, lumbar region: Secondary | ICD-10-CM | POA: Diagnosis not present

## 2020-05-05 DIAGNOSIS — M545 Low back pain: Secondary | ICD-10-CM | POA: Diagnosis not present

## 2020-05-05 DIAGNOSIS — M5416 Radiculopathy, lumbar region: Secondary | ICD-10-CM | POA: Diagnosis not present

## 2020-05-13 DIAGNOSIS — M47816 Spondylosis without myelopathy or radiculopathy, lumbar region: Secondary | ICD-10-CM | POA: Diagnosis not present

## 2020-05-13 DIAGNOSIS — M5136 Other intervertebral disc degeneration, lumbar region: Secondary | ICD-10-CM | POA: Diagnosis not present

## 2020-05-13 DIAGNOSIS — M48061 Spinal stenosis, lumbar region without neurogenic claudication: Secondary | ICD-10-CM | POA: Diagnosis not present

## 2020-05-21 ENCOUNTER — Telehealth: Payer: Self-pay

## 2020-05-21 NOTE — Telephone Encounter (Signed)
[name] from [facility] calling back. She sates they will not be using anesthesia.

## 2020-05-21 NOTE — Telephone Encounter (Signed)
I have spoken to the patient by phone for telephone assessment. He is doing well. He will call for his annual appointment with Dr. Stanford Breed after procedure is complete.  Jory Sims DNP, ANP.

## 2020-05-21 NOTE — Telephone Encounter (Signed)
   [facility] Pre-operative Risk Assessment     Request for surgical clearance:  1. What type of surgery is being performed? ESI   2. When is this surgery scheduled? 06-09-2020   3. What type of clearance is required (medical clearance vs. Pharmacy clearance to hold med vs. Both)? pharmacy  4. Are there any medications that need to be held prior to surgery and how long? Asa-1 week prior   5. Practice name and name of physician performing surgery? [facility]    6. What is the office phone number? [phone]   7.   What is the office fax number? [phone]  8.   Anesthesia type (None, local, MAC, general) ? NOT LISTED-LM2CB

## 2020-05-21 NOTE — Telephone Encounter (Signed)
   Primary Cardiologist: Dr. Stanford Breed  Chart reviewed as part of pre-operative protocol coverage. Given past medical history and time since last visit, based on ACC/AHA guidelines, Jerry DEBARR Sr. would be at acceptable risk for the planned procedure without further cardiovascular testing. Okay to hold ASA prior to the procedure and restart ASAP afterwards   I will route this recommendation to the requesting party via Epic fax function and remove from pre-op pool.  Please call with questions.  Phill Myron. [place] Pugh, ANP, AACC  05/21/2020, 2:28 PM

## 2020-06-09 DIAGNOSIS — E119 Type 2 diabetes mellitus without complications: Secondary | ICD-10-CM | POA: Diagnosis not present

## 2020-06-09 DIAGNOSIS — Z7982 Long term (current) use of aspirin: Secondary | ICD-10-CM | POA: Diagnosis not present

## 2020-06-09 DIAGNOSIS — M47816 Spondylosis without myelopathy or radiculopathy, lumbar region: Secondary | ICD-10-CM | POA: Diagnosis not present

## 2020-06-09 DIAGNOSIS — I10 Essential (primary) hypertension: Secondary | ICD-10-CM | POA: Diagnosis not present

## 2020-06-09 DIAGNOSIS — Z7984 Long term (current) use of oral hypoglycemic drugs: Secondary | ICD-10-CM | POA: Diagnosis not present

## 2020-06-09 DIAGNOSIS — M5136 Other intervertebral disc degeneration, lumbar region: Secondary | ICD-10-CM | POA: Diagnosis not present

## 2020-06-09 DIAGNOSIS — Z791 Long term (current) use of non-steroidal anti-inflammatories (NSAID): Secondary | ICD-10-CM | POA: Diagnosis not present

## 2020-06-09 DIAGNOSIS — M5126 Other intervertebral disc displacement, lumbar region: Secondary | ICD-10-CM | POA: Diagnosis not present

## 2020-06-21 DIAGNOSIS — E785 Hyperlipidemia, unspecified: Secondary | ICD-10-CM | POA: Diagnosis not present

## 2020-06-21 DIAGNOSIS — E119 Type 2 diabetes mellitus without complications: Secondary | ICD-10-CM | POA: Diagnosis not present

## 2020-06-21 DIAGNOSIS — I10 Essential (primary) hypertension: Secondary | ICD-10-CM | POA: Diagnosis not present

## 2020-06-21 DIAGNOSIS — Z23 Encounter for immunization: Secondary | ICD-10-CM | POA: Diagnosis not present

## 2020-06-21 DIAGNOSIS — D649 Anemia, unspecified: Secondary | ICD-10-CM | POA: Diagnosis not present

## 2020-06-23 DIAGNOSIS — Z0000 Encounter for general adult medical examination without abnormal findings: Secondary | ICD-10-CM | POA: Diagnosis not present

## 2020-06-23 DIAGNOSIS — E119 Type 2 diabetes mellitus without complications: Secondary | ICD-10-CM | POA: Diagnosis not present

## 2020-06-23 DIAGNOSIS — E785 Hyperlipidemia, unspecified: Secondary | ICD-10-CM | POA: Diagnosis not present

## 2020-06-23 DIAGNOSIS — Z7984 Long term (current) use of oral hypoglycemic drugs: Secondary | ICD-10-CM | POA: Diagnosis not present

## 2020-06-23 DIAGNOSIS — I2510 Atherosclerotic heart disease of native coronary artery without angina pectoris: Secondary | ICD-10-CM | POA: Diagnosis not present

## 2020-06-23 DIAGNOSIS — I10 Essential (primary) hypertension: Secondary | ICD-10-CM | POA: Diagnosis not present

## 2020-06-23 DIAGNOSIS — Z7982 Long term (current) use of aspirin: Secondary | ICD-10-CM | POA: Diagnosis not present

## 2020-06-23 DIAGNOSIS — M5416 Radiculopathy, lumbar region: Secondary | ICD-10-CM | POA: Diagnosis not present

## 2020-06-23 DIAGNOSIS — E538 Deficiency of other specified B group vitamins: Secondary | ICD-10-CM | POA: Diagnosis not present

## 2020-06-23 DIAGNOSIS — I714 Abdominal aortic aneurysm, without rupture: Secondary | ICD-10-CM | POA: Diagnosis not present

## 2020-06-30 DIAGNOSIS — E119 Type 2 diabetes mellitus without complications: Secondary | ICD-10-CM | POA: Diagnosis not present

## 2020-06-30 DIAGNOSIS — H2703 Aphakia, bilateral: Secondary | ICD-10-CM | POA: Diagnosis not present

## 2020-07-20 ENCOUNTER — Other Ambulatory Visit: Payer: Self-pay

## 2020-07-20 ENCOUNTER — Ambulatory Visit (HOSPITAL_COMMUNITY)
Admission: RE | Admit: 2020-07-20 | Discharge: 2020-07-20 | Disposition: A | Discharge status: Home / Self Care | Payer: PPO | Source: Ambulatory Visit | Attending: Cardiovascular Disease | Admitting: Cardiovascular Disease

## 2020-07-20 ENCOUNTER — Encounter: Payer: Self-pay | Admitting: *Deleted

## 2020-07-20 ENCOUNTER — Other Ambulatory Visit: Payer: Self-pay | Admitting: Cardiology

## 2020-07-20 DIAGNOSIS — I714 Abdominal aortic aneurysm, without rupture, unspecified: Secondary | ICD-10-CM

## 2020-07-26 DIAGNOSIS — E538 Deficiency of other specified B group vitamins: Secondary | ICD-10-CM | POA: Diagnosis not present

## 2020-08-12 DIAGNOSIS — M47816 Spondylosis without myelopathy or radiculopathy, lumbar region: Secondary | ICD-10-CM | POA: Diagnosis not present

## 2020-08-12 DIAGNOSIS — M5136 Other intervertebral disc degeneration, lumbar region: Secondary | ICD-10-CM | POA: Diagnosis not present

## 2020-08-26 DIAGNOSIS — E538 Deficiency of other specified B group vitamins: Secondary | ICD-10-CM | POA: Diagnosis not present

## 2020-09-07 DIAGNOSIS — M47816 Spondylosis without myelopathy or radiculopathy, lumbar region: Secondary | ICD-10-CM | POA: Diagnosis not present

## 2020-09-23 NOTE — Progress Notes (Signed)
HPI:FU CAD; history of PCI of his right coronary artery in June 2000. His last carotid Dopplers in April 2011 showed 0-39% bilateral stenosis. Nuclear study May 2014 showed an ejection fraction of 59%. There was soft tissue attenuation but no ischemia. Abdominal ultrasound October 2021 showed 2.9 cm abdominal aortic aneurysm and follow-up recommended 24 months.  Since I last saw him,he denies dyspnea, chest pain, palpitations or syncope.  Current Outpatient Medications  Medication Sig Dispense Refill  . amLODipine (NORVASC) 10 MG tablet Take 1 tablet by mouth daily.    Marland Kitchen amLODipine (NORVASC) 5 MG tablet Take 1 tablet (5 mg total) by mouth daily. 30 tablet 12  . aspirin 81 MG tablet Take 81 mg by mouth daily.    Marland Kitchen atorvastatin (LIPITOR) 80 MG tablet Take 80 mg by mouth daily.    . betamethasone valerate (VALISONE) 0.1 % cream Apply topically 2 (two) times daily.    . carvedilol (COREG) 25 MG tablet Take 12.5 mg by mouth 2 (two) times daily with a meal.     . celecoxib (CELEBREX) 100 MG capsule Take 100 mg by mouth 2 (two) times daily.    . cholecalciferol (VITAMIN D) 1000 units tablet Take 1,000 Units by mouth daily.    Marland Kitchen gabapentin (NEURONTIN) 600 MG tablet 3 (three) times daily.  3  . glimepiride (AMARYL) 4 MG tablet Take 6 mg by mouth daily. Take 1 1/2 tablet daily    . hydrochlorothiazide (MICROZIDE) 12.5 MG capsule Take 12.5 mg by mouth daily.    . metFORMIN (GLUCOPHAGE) 1000 MG tablet Take 1,000 mg by mouth 2 (two) times daily with a meal.    . Multiple Vitamin (MULTIVITAMIN WITH MINERALS) TABS tablet Take 1 tablet by mouth daily.    . pioglitazone (ACTOS) 45 MG tablet Take one daily     No current facility-administered medications for this visit.     Past Medical History:  Diagnosis Date  . AAA   . CAD   . CEREBROVASCULAR DISEASE   . DM   . HYPERLIPIDEMIA   . HYPERTENSION     History reviewed. No pertinent surgical history.  Social History   Socioeconomic History   . Marital status: Married    Spouse name: Not on file  . Number of children: Not on file  . Years of education: Not on file  . Highest education level: Not on file  Occupational History  . Not on file  Tobacco Use  . Smoking status: Former Games developer  . Smokeless tobacco: Never Used  Substance and Sexual Activity  . Alcohol use: Yes  . Drug use: No  . Sexual activity: Not on file  Other Topics Concern  . Not on file  Social History Narrative  . Not on file   Social Determinants of Health   Financial Resource Strain: Not on file  Food Insecurity: Not on file  Transportation Needs: Not on file  Physical Activity: Not on file  Stress: Not on file  Social Connections: Not on file  Intimate Partner Violence: Not on file    History reviewed. No pertinent family history.  ROS: Chronic back pain but no fevers or chills, productive cough, hemoptysis, dysphasia, odynophagia, melena, hematochezia, dysuria, hematuria, rash, seizure activity, orthopnea, PND, pedal edema, claudication. Remaining systems are negative.  Physical Exam: Well-developed well-nourished in no acute distress.  Skin is warm and dry.  HEENT is normal.  Neck is supple.  Chest is clear to auscultation with normal expansion.  Cardiovascular exam is regular rate and rhythm.  Abdominal exam nontender or distended. No masses palpated. Extremities show no edema. neuro grossly intact  ECG-normal sinus rhythm, left axis deviation, left ventricular hypertrophy.  Personally reviewed  A/P  1 coronary artery disease-patient denies chest pain.  Continue medical therapy with aspirin and statin.  2 hypertension-blood pressure controlled today.  Continue present medical regimen and follow.  3 hyperlipidemia-continue statin.  Most recent laboratories August 2021 showed total cholesterol 171 and LDL 70.  4 abdominal aortic aneurysm-follow-up ultrasound October 2023.  Kirk Ruths, MD

## 2020-09-27 DIAGNOSIS — E538 Deficiency of other specified B group vitamins: Secondary | ICD-10-CM | POA: Diagnosis not present

## 2020-09-30 ENCOUNTER — Encounter (INDEPENDENT_AMBULATORY_CARE_PROVIDER_SITE_OTHER): Payer: Self-pay

## 2020-09-30 ENCOUNTER — Encounter: Payer: Self-pay | Admitting: Cardiology

## 2020-09-30 ENCOUNTER — Ambulatory Visit: Payer: PPO | Admitting: Cardiology

## 2020-09-30 ENCOUNTER — Other Ambulatory Visit: Payer: Self-pay

## 2020-09-30 VITALS — BP 124/90 | HR 75 | Ht 70.0 in | Wt 245.2 lb

## 2020-09-30 DIAGNOSIS — I714 Abdominal aortic aneurysm, without rupture, unspecified: Secondary | ICD-10-CM

## 2020-09-30 DIAGNOSIS — E7800 Pure hypercholesterolemia, unspecified: Secondary | ICD-10-CM

## 2020-09-30 DIAGNOSIS — I2510 Atherosclerotic heart disease of native coronary artery without angina pectoris: Secondary | ICD-10-CM

## 2020-09-30 DIAGNOSIS — I10 Essential (primary) hypertension: Secondary | ICD-10-CM

## 2020-09-30 NOTE — Patient Instructions (Signed)

## 2020-10-26 DIAGNOSIS — E538 Deficiency of other specified B group vitamins: Secondary | ICD-10-CM | POA: Diagnosis not present

## 2020-11-23 DIAGNOSIS — E538 Deficiency of other specified B group vitamins: Secondary | ICD-10-CM | POA: Diagnosis not present

## 2020-12-21 DIAGNOSIS — E119 Type 2 diabetes mellitus without complications: Secondary | ICD-10-CM | POA: Diagnosis not present

## 2020-12-21 DIAGNOSIS — E538 Deficiency of other specified B group vitamins: Secondary | ICD-10-CM | POA: Diagnosis not present

## 2020-12-21 DIAGNOSIS — E785 Hyperlipidemia, unspecified: Secondary | ICD-10-CM | POA: Diagnosis not present

## 2020-12-28 DIAGNOSIS — I714 Abdominal aortic aneurysm, without rupture: Secondary | ICD-10-CM | POA: Diagnosis not present

## 2020-12-28 DIAGNOSIS — I10 Essential (primary) hypertension: Secondary | ICD-10-CM | POA: Diagnosis not present

## 2020-12-28 DIAGNOSIS — M5416 Radiculopathy, lumbar region: Secondary | ICD-10-CM | POA: Diagnosis not present

## 2020-12-28 DIAGNOSIS — E538 Deficiency of other specified B group vitamins: Secondary | ICD-10-CM | POA: Diagnosis not present

## 2020-12-28 DIAGNOSIS — E785 Hyperlipidemia, unspecified: Secondary | ICD-10-CM | POA: Diagnosis not present

## 2020-12-28 DIAGNOSIS — D649 Anemia, unspecified: Secondary | ICD-10-CM | POA: Diagnosis not present

## 2020-12-28 DIAGNOSIS — E119 Type 2 diabetes mellitus without complications: Secondary | ICD-10-CM | POA: Diagnosis not present

## 2020-12-28 DIAGNOSIS — I2510 Atherosclerotic heart disease of native coronary artery without angina pectoris: Secondary | ICD-10-CM | POA: Diagnosis not present

## 2021-01-27 DIAGNOSIS — E538 Deficiency of other specified B group vitamins: Secondary | ICD-10-CM | POA: Diagnosis not present

## 2021-02-22 DIAGNOSIS — M545 Low back pain, unspecified: Secondary | ICD-10-CM | POA: Diagnosis not present

## 2021-02-22 DIAGNOSIS — R531 Weakness: Secondary | ICD-10-CM | POA: Diagnosis not present

## 2021-02-22 DIAGNOSIS — M2560 Stiffness of unspecified joint, not elsewhere classified: Secondary | ICD-10-CM | POA: Diagnosis not present

## 2021-02-25 DIAGNOSIS — M2560 Stiffness of unspecified joint, not elsewhere classified: Secondary | ICD-10-CM | POA: Diagnosis not present

## 2021-02-25 DIAGNOSIS — R531 Weakness: Secondary | ICD-10-CM | POA: Diagnosis not present

## 2021-02-25 DIAGNOSIS — M545 Low back pain, unspecified: Secondary | ICD-10-CM | POA: Diagnosis not present

## 2021-02-28 DIAGNOSIS — M545 Low back pain, unspecified: Secondary | ICD-10-CM | POA: Diagnosis not present

## 2021-02-28 DIAGNOSIS — R531 Weakness: Secondary | ICD-10-CM | POA: Diagnosis not present

## 2021-02-28 DIAGNOSIS — E538 Deficiency of other specified B group vitamins: Secondary | ICD-10-CM | POA: Diagnosis not present

## 2021-02-28 DIAGNOSIS — M2560 Stiffness of unspecified joint, not elsewhere classified: Secondary | ICD-10-CM | POA: Diagnosis not present

## 2021-03-02 DIAGNOSIS — R531 Weakness: Secondary | ICD-10-CM | POA: Diagnosis not present

## 2021-03-02 DIAGNOSIS — M545 Low back pain, unspecified: Secondary | ICD-10-CM | POA: Diagnosis not present

## 2021-03-02 DIAGNOSIS — M2560 Stiffness of unspecified joint, not elsewhere classified: Secondary | ICD-10-CM | POA: Diagnosis not present

## 2021-03-07 DIAGNOSIS — M47816 Spondylosis without myelopathy or radiculopathy, lumbar region: Secondary | ICD-10-CM | POA: Diagnosis not present

## 2021-03-08 DIAGNOSIS — R531 Weakness: Secondary | ICD-10-CM | POA: Diagnosis not present

## 2021-03-08 DIAGNOSIS — M545 Low back pain, unspecified: Secondary | ICD-10-CM | POA: Diagnosis not present

## 2021-03-08 DIAGNOSIS — M2560 Stiffness of unspecified joint, not elsewhere classified: Secondary | ICD-10-CM | POA: Diagnosis not present

## 2021-03-10 DIAGNOSIS — M2560 Stiffness of unspecified joint, not elsewhere classified: Secondary | ICD-10-CM | POA: Diagnosis not present

## 2021-03-10 DIAGNOSIS — M545 Low back pain, unspecified: Secondary | ICD-10-CM | POA: Diagnosis not present

## 2021-03-10 DIAGNOSIS — R531 Weakness: Secondary | ICD-10-CM | POA: Diagnosis not present

## 2021-03-15 DIAGNOSIS — M545 Low back pain, unspecified: Secondary | ICD-10-CM | POA: Diagnosis not present

## 2021-03-15 DIAGNOSIS — M2560 Stiffness of unspecified joint, not elsewhere classified: Secondary | ICD-10-CM | POA: Diagnosis not present

## 2021-03-15 DIAGNOSIS — R531 Weakness: Secondary | ICD-10-CM | POA: Diagnosis not present

## 2021-03-17 DIAGNOSIS — M545 Low back pain, unspecified: Secondary | ICD-10-CM | POA: Diagnosis not present

## 2021-03-17 DIAGNOSIS — M2560 Stiffness of unspecified joint, not elsewhere classified: Secondary | ICD-10-CM | POA: Diagnosis not present

## 2021-03-17 DIAGNOSIS — R531 Weakness: Secondary | ICD-10-CM | POA: Diagnosis not present

## 2021-03-22 DIAGNOSIS — R531 Weakness: Secondary | ICD-10-CM | POA: Diagnosis not present

## 2021-03-22 DIAGNOSIS — M545 Low back pain, unspecified: Secondary | ICD-10-CM | POA: Diagnosis not present

## 2021-03-22 DIAGNOSIS — M2560 Stiffness of unspecified joint, not elsewhere classified: Secondary | ICD-10-CM | POA: Diagnosis not present

## 2021-03-24 DIAGNOSIS — R531 Weakness: Secondary | ICD-10-CM | POA: Diagnosis not present

## 2021-03-24 DIAGNOSIS — M2560 Stiffness of unspecified joint, not elsewhere classified: Secondary | ICD-10-CM | POA: Diagnosis not present

## 2021-03-24 DIAGNOSIS — M545 Low back pain, unspecified: Secondary | ICD-10-CM | POA: Diagnosis not present

## 2021-03-29 DIAGNOSIS — R531 Weakness: Secondary | ICD-10-CM | POA: Diagnosis not present

## 2021-03-29 DIAGNOSIS — E538 Deficiency of other specified B group vitamins: Secondary | ICD-10-CM | POA: Diagnosis not present

## 2021-03-29 DIAGNOSIS — M545 Low back pain, unspecified: Secondary | ICD-10-CM | POA: Diagnosis not present

## 2021-03-29 DIAGNOSIS — M2560 Stiffness of unspecified joint, not elsewhere classified: Secondary | ICD-10-CM | POA: Diagnosis not present

## 2021-03-31 DIAGNOSIS — M545 Low back pain, unspecified: Secondary | ICD-10-CM | POA: Diagnosis not present

## 2021-03-31 DIAGNOSIS — M2560 Stiffness of unspecified joint, not elsewhere classified: Secondary | ICD-10-CM | POA: Diagnosis not present

## 2021-03-31 DIAGNOSIS — R531 Weakness: Secondary | ICD-10-CM | POA: Diagnosis not present

## 2021-04-04 DIAGNOSIS — M47816 Spondylosis without myelopathy or radiculopathy, lumbar region: Secondary | ICD-10-CM | POA: Diagnosis not present

## 2021-04-05 DIAGNOSIS — M2560 Stiffness of unspecified joint, not elsewhere classified: Secondary | ICD-10-CM | POA: Diagnosis not present

## 2021-04-05 DIAGNOSIS — R531 Weakness: Secondary | ICD-10-CM | POA: Diagnosis not present

## 2021-04-05 DIAGNOSIS — M545 Low back pain, unspecified: Secondary | ICD-10-CM | POA: Diagnosis not present

## 2021-04-26 DIAGNOSIS — I10 Essential (primary) hypertension: Secondary | ICD-10-CM | POA: Diagnosis not present

## 2021-04-26 DIAGNOSIS — M5136 Other intervertebral disc degeneration, lumbar region: Secondary | ICD-10-CM | POA: Diagnosis not present

## 2021-04-26 DIAGNOSIS — M47816 Spondylosis without myelopathy or radiculopathy, lumbar region: Secondary | ICD-10-CM | POA: Diagnosis not present

## 2021-04-26 DIAGNOSIS — Z7984 Long term (current) use of oral hypoglycemic drugs: Secondary | ICD-10-CM | POA: Diagnosis not present

## 2021-04-26 DIAGNOSIS — E119 Type 2 diabetes mellitus without complications: Secondary | ICD-10-CM | POA: Diagnosis not present

## 2021-04-29 DIAGNOSIS — E538 Deficiency of other specified B group vitamins: Secondary | ICD-10-CM | POA: Diagnosis not present

## 2021-05-31 DIAGNOSIS — E538 Deficiency of other specified B group vitamins: Secondary | ICD-10-CM | POA: Diagnosis not present

## 2021-06-29 DIAGNOSIS — E119 Type 2 diabetes mellitus without complications: Secondary | ICD-10-CM | POA: Diagnosis not present

## 2021-06-29 DIAGNOSIS — I10 Essential (primary) hypertension: Secondary | ICD-10-CM | POA: Diagnosis not present

## 2021-06-29 DIAGNOSIS — E785 Hyperlipidemia, unspecified: Secondary | ICD-10-CM | POA: Diagnosis not present

## 2021-06-29 DIAGNOSIS — D649 Anemia, unspecified: Secondary | ICD-10-CM | POA: Diagnosis not present

## 2021-07-21 DIAGNOSIS — D649 Anemia, unspecified: Secondary | ICD-10-CM | POA: Diagnosis not present

## 2021-07-21 DIAGNOSIS — I2510 Atherosclerotic heart disease of native coronary artery without angina pectoris: Secondary | ICD-10-CM | POA: Diagnosis not present

## 2021-07-21 DIAGNOSIS — I714 Abdominal aortic aneurysm, without rupture, unspecified: Secondary | ICD-10-CM | POA: Diagnosis not present

## 2021-07-21 DIAGNOSIS — M5416 Radiculopathy, lumbar region: Secondary | ICD-10-CM | POA: Diagnosis not present

## 2021-07-21 DIAGNOSIS — E538 Deficiency of other specified B group vitamins: Secondary | ICD-10-CM | POA: Diagnosis not present

## 2021-07-21 DIAGNOSIS — I10 Essential (primary) hypertension: Secondary | ICD-10-CM | POA: Diagnosis not present

## 2021-07-21 DIAGNOSIS — M7989 Other specified soft tissue disorders: Secondary | ICD-10-CM | POA: Diagnosis not present

## 2021-07-21 DIAGNOSIS — I679 Cerebrovascular disease, unspecified: Secondary | ICD-10-CM | POA: Diagnosis not present

## 2021-07-21 DIAGNOSIS — E782 Mixed hyperlipidemia: Secondary | ICD-10-CM | POA: Diagnosis not present

## 2021-07-21 DIAGNOSIS — S92405A Nondisplaced unspecified fracture of left great toe, initial encounter for closed fracture: Secondary | ICD-10-CM | POA: Diagnosis not present

## 2021-07-21 DIAGNOSIS — E119 Type 2 diabetes mellitus without complications: Secondary | ICD-10-CM | POA: Diagnosis not present

## 2021-07-21 DIAGNOSIS — L089 Local infection of the skin and subcutaneous tissue, unspecified: Secondary | ICD-10-CM | POA: Diagnosis not present

## 2021-07-22 ENCOUNTER — Emergency Department (HOSPITAL_COMMUNITY): Payer: PPO

## 2021-07-22 ENCOUNTER — Emergency Department (HOSPITAL_COMMUNITY)
Admission: EM | Admit: 2021-07-22 | Discharge: 2021-07-23 | Disposition: A | Discharge status: Home / Self Care | Payer: PPO | Attending: Emergency Medicine | Admitting: Emergency Medicine

## 2021-07-22 ENCOUNTER — Encounter (HOSPITAL_COMMUNITY): Payer: Self-pay | Admitting: Emergency Medicine

## 2021-07-22 DIAGNOSIS — Z7984 Long term (current) use of oral hypoglycemic drugs: Secondary | ICD-10-CM | POA: Diagnosis not present

## 2021-07-22 DIAGNOSIS — I10 Essential (primary) hypertension: Secondary | ICD-10-CM | POA: Insufficient documentation

## 2021-07-22 DIAGNOSIS — I2510 Atherosclerotic heart disease of native coronary artery without angina pectoris: Secondary | ICD-10-CM | POA: Diagnosis not present

## 2021-07-22 DIAGNOSIS — S99922A Unspecified injury of left foot, initial encounter: Secondary | ICD-10-CM | POA: Diagnosis not present

## 2021-07-22 DIAGNOSIS — M79675 Pain in left toe(s): Secondary | ICD-10-CM | POA: Diagnosis present

## 2021-07-22 DIAGNOSIS — Z79899 Other long term (current) drug therapy: Secondary | ICD-10-CM | POA: Diagnosis not present

## 2021-07-22 DIAGNOSIS — E119 Type 2 diabetes mellitus without complications: Secondary | ICD-10-CM | POA: Diagnosis not present

## 2021-07-22 DIAGNOSIS — L03116 Cellulitis of left lower limb: Secondary | ICD-10-CM | POA: Diagnosis not present

## 2021-07-22 DIAGNOSIS — E871 Hypo-osmolality and hyponatremia: Secondary | ICD-10-CM | POA: Insufficient documentation

## 2021-07-22 DIAGNOSIS — E876 Hypokalemia: Secondary | ICD-10-CM | POA: Diagnosis not present

## 2021-07-22 DIAGNOSIS — Z7982 Long term (current) use of aspirin: Secondary | ICD-10-CM | POA: Diagnosis not present

## 2021-07-22 DIAGNOSIS — Z87891 Personal history of nicotine dependence: Secondary | ICD-10-CM | POA: Diagnosis not present

## 2021-07-22 DIAGNOSIS — R599 Enlarged lymph nodes, unspecified: Secondary | ICD-10-CM | POA: Insufficient documentation

## 2021-07-22 DIAGNOSIS — L03032 Cellulitis of left toe: Secondary | ICD-10-CM | POA: Diagnosis not present

## 2021-07-22 DIAGNOSIS — M7732 Calcaneal spur, left foot: Secondary | ICD-10-CM | POA: Diagnosis not present

## 2021-07-22 DIAGNOSIS — M19072 Primary osteoarthritis, left ankle and foot: Secondary | ICD-10-CM | POA: Diagnosis not present

## 2021-07-22 DIAGNOSIS — S92425A Nondisplaced fracture of distal phalanx of left great toe, initial encounter for closed fracture: Secondary | ICD-10-CM | POA: Diagnosis not present

## 2021-07-22 DIAGNOSIS — M7989 Other specified soft tissue disorders: Secondary | ICD-10-CM | POA: Diagnosis not present

## 2021-07-22 LAB — COMPREHENSIVE METABOLIC PANEL
ALT: 18 U/L (ref 0–44)
AST: 23 U/L (ref 15–41)
Albumin: 3.7 g/dL (ref 3.5–5.0)
Alkaline Phosphatase: 67 U/L (ref 38–126)
Anion gap: 10 (ref 5–15)
BUN: 17 mg/dL (ref 8–23)
CO2: 25 mmol/L (ref 22–32)
Calcium: 9.5 mg/dL (ref 8.9–10.3)
Chloride: 96 mmol/L — ABNORMAL LOW (ref 98–111)
Creatinine, Ser: 1.16 mg/dL (ref 0.61–1.24)
GFR, Estimated: >60 mL/min (ref >60)
Glucose, Bld: 135 mg/dL — ABNORMAL HIGH (ref 70–99)
Potassium: 3.4 mmol/L — ABNORMAL LOW (ref 3.5–5.1)
Sodium: 131 mmol/L — ABNORMAL LOW (ref 135–145)
Total Bilirubin: 1.1 mg/dL (ref 0.3–1.2)
Total Protein: 6.1 g/dL — ABNORMAL LOW (ref 6.5–8.1)

## 2021-07-22 LAB — CBC WITH DIFFERENTIAL/PLATELET
Abs Immature Granulocytes: 0.05 10*3/uL (ref 0.00–0.07)
Basophils Absolute: 0.1 10*3/uL (ref 0.0–0.1)
Basophils Relative: 1 %
Eosinophils Absolute: 0.2 10*3/uL (ref 0.0–0.5)
Eosinophils Relative: 2 %
HCT: 41.6 % (ref 39.0–52.0)
Hemoglobin: 14.1 g/dL (ref 13.0–17.0)
Immature Granulocytes: 1 %
Lymphocytes Relative: 28 %
Lymphs Abs: 2.4 10*3/uL (ref 0.7–4.0)
MCH: 33.4 pg (ref 26.0–34.0)
MCHC: 33.9 g/dL (ref 30.0–36.0)
MCV: 98.6 fL (ref 80.0–100.0)
Monocytes Absolute: 0.9 10*3/uL (ref 0.1–1.0)
Monocytes Relative: 11 %
Neutro Abs: 4.9 10*3/uL (ref 1.7–7.7)
Neutrophils Relative %: 57 %
Platelets: 314 10*3/uL (ref 150–400)
RBC: 4.22 MIL/uL (ref 4.22–5.81)
RDW: 13 % (ref 11.5–15.5)
WBC: 8.5 10*3/uL (ref 4.0–10.5)
nRBC: 0 % (ref 0.0–0.2)

## 2021-07-22 LAB — LACTIC ACID, PLASMA
Lactic Acid, Venous: 1.5 mmol/L (ref 0.5–1.9)
Lactic Acid, Venous: 1.4 mmol/L (ref 0.5–1.9)

## 2021-07-22 NOTE — ED Provider Notes (Signed)
[facility] EMERGENCY DEPARTMENT Provider Note   CSN: [id number] Arrival date & time: 07/22/21  1532     History Chief Complaint  Patient presents with   Toe Injury    Jerry BOSTIC Sr. is a 75 y.o. male.  HPI  75 year old male with PMH significant for AAA, type II DM, HLD, HTN, CAD, and others as below who presents to the ED accompanied by his son with complaints of left toe pain.  Patient sustained a left great toe fracture approximately 3 weeks ago, and since that time has had intermittent pain, swelling, and erythema of the toe.  He has been following with his PCP.  He states all of his symptoms were improving, but he noted some new erythema and purulence around the left great toenail bed.  Followed up with his PCP, who started him on Keflex, which she has been taking for 2 days.  He underwent a repeat XR on 10/27, which noted possible osteomyelitis.  He was sent to this ED by his PCP.  He denies any fevers or chills, rashes, nausea or vomiting, chest pain, rash, new edema, worsening pain, or any other complaints.  He has been ambulating without difficulty.   Past Medical History:  Diagnosis Date   AAA    CAD    CEREBROVASCULAR DISEASE    DM    HYPERLIPIDEMIA    HYPERTENSION     Patient Active Problem List   Diagnosis Date Noted   Abdominal aortic aneurysm 11/08/2009   DM 11/04/2009   HYPERLIPIDEMIA 11/04/2009   Essential hypertension 11/04/2009   Coronary atherosclerosis 11/04/2009   Cerebrovascular disease 11/04/2009    No past surgical history on file.     No family history on file.  Social History   Tobacco Use   Smoking status: Former   Smokeless tobacco: Never  Substance Use Topics   Alcohol use: Yes   Drug use: No    Home Medications Prior to Admission medications   Medication Sig Start Date End Date Taking? Authorizing Provider  amLODipine (NORVASC) 10 MG tablet Take 1 tablet by mouth daily. 12/12/18   [provider]  amLODipine (NORVASC) 5 MG tablet Take 1 tablet (5 mg total) by mouth daily. 01/14/13   Lelon Perla, MD  aspirin 81 MG tablet Take 81 mg by mouth daily.    [provider]  atorvastatin (LIPITOR) 80 MG tablet Take 80 mg by mouth daily.    [provider]  betamethasone valerate (VALISONE) 0.1 % cream Apply topically 2 (two) times daily.    [provider]  carvedilol (COREG) 25 MG tablet Take 12.5 mg by mouth 2 (two) times daily with a meal.     [provider]  celecoxib (CELEBREX) 100 MG capsule Take 100 mg by mouth 2 (two) times daily. 07/21/20   [provider]  cholecalciferol (VITAMIN D) 1000 units tablet Take 1,000 Units by mouth daily.    [provider]  gabapentin (NEURONTIN) 600 MG tablet 3 (three) times daily. 06/17/18   [provider]  glimepiride (AMARYL) 4 MG tablet Take 6 mg by mouth daily. Take 1 1/2 tablet daily    [provider]  hydrochlorothiazide (MICROZIDE) 12.5 MG capsule Take 12.5 mg by mouth daily.    [provider]  metFORMIN (GLUCOPHAGE) 1000 MG tablet Take 1,000 mg by mouth 2 (two) times daily with a meal.    [provider]  Multiple Vitamin (MULTIVITAMIN WITH MINERALS) TABS tablet  Take 1 tablet by mouth daily.    [provider]  pioglitazone (ACTOS) 45 MG tablet Take one daily 12/26/12   [provider]    Allergies    Patient has no known allergies.  Review of Systems   Review of Systems  Constitutional:  Negative for activity change, appetite change, chills and fever.  HENT:  Negative for ear pain and sore throat.   Eyes:  Negative for photophobia, pain and visual disturbance.  Respiratory:  Negative for cough and shortness of breath.   Cardiovascular:  Negative for chest pain and palpitations.  Gastrointestinal:  Negative for abdominal pain and vomiting.  Genitourinary:  Negative for dysuria and hematuria.  Musculoskeletal:  Positive for  joint swelling. Negative for arthralgias and back pain.  Skin:  Positive for wound. Negative for color change, pallor and rash.  Neurological:  Negative for dizziness, seizures, syncope and light-headedness.  Hematological:  Does not bruise/bleed easily.  Psychiatric/Behavioral:  Negative for confusion. The patient is not nervous/anxious.   All other systems reviewed and are negative.  Physical Exam Updated Vital Signs BP (!) 127/52 (BP Location: Right Arm)   Pulse 82   Temp 98.4 F (36.9 C) (Oral)   Resp 18   SpO2 98%   Physical Exam Vitals and nursing note reviewed.  Constitutional:      General: He is not in acute distress.    Appearance: Normal appearance. He is well-developed. He is obese. He is not ill-appearing or toxic-appearing.  HENT:     Head: Normocephalic and atraumatic.     Right Ear: External ear normal.     Left Ear: External ear normal.     Nose: Nose normal.     Mouth/Throat:     Mouth: Mucous membranes are moist.     Pharynx: Oropharynx is clear.  Eyes:     Conjunctiva/sclera: Conjunctivae normal.  Cardiovascular:     Rate and Rhythm: Normal rate and regular rhythm.     Heart sounds: No murmur heard. Pulmonary:     Effort: Pulmonary effort is normal. No respiratory distress.     Breath sounds: Normal breath sounds.  Abdominal:     General: There is no distension.     Palpations: Abdomen is soft.     Tenderness: There is no abdominal tenderness.  Musculoskeletal:        General: Normal range of motion.     Cervical back: Normal range of motion and neck supple. No rigidity or tenderness.     Right lower leg: 2+ Pitting Edema present.     Left lower leg: 2+ Pitting Edema present.     Right foot: Normal.     Left foot: Normal range of motion and normal capillary refill. Swelling, tenderness and bony tenderness present. No crepitus. Normal pulse.       Legs:     Comments: Left great toe erythema with mild TTP.  Purulence around the nailbed.   Neurovascularly intact.  Lymphadenopathy:     Cervical: Cervical adenopathy present.  Skin:    General: Skin is warm and dry.     Capillary Refill: Capillary refill takes less than 2 seconds.  Neurological:     General: No focal deficit present.     Mental Status: He is alert and oriented to person, place, and time. Mental status is at baseline.  Psychiatric:        Mood and Affect: Mood normal.        Behavior: Behavior normal.  ED Results / Procedures / Treatments   Labs (all labs ordered are listed, but only abnormal results are displayed) Labs Reviewed  COMPREHENSIVE METABOLIC PANEL - Abnormal; Notable for the following components:      Result Value   Sodium 131 (*)    Potassium 3.4 (*)    Chloride 96 (*)    Glucose, Bld 135 (*)    Total Protein 6.1 (*)    All other components within normal limits  LACTIC ACID, PLASMA  LACTIC ACID, PLASMA  CBC WITH DIFFERENTIAL/PLATELET  SEDIMENTATION RATE  C-REACTIVE PROTEIN    EKG None  Radiology DG Foot Complete Left  Result Date: 07/22/2021 CLINICAL DATA:  Left toe concern for osteomyelitis, status post fracture. Recent toe x-ray demonstrated osteomyelitis in the great toe after injury approximately 3 weeks ago. EXAM: LEFT FOOT - COMPLETE 3+ VIEW COMPARISON:  Great toe radiograph yesterday. FINDINGS: Stable appearance of great toe distal phalanx fracture compared to radiograph yesterday. There is slight adjacent periosteal reaction. No other fracture of the foot. No erosions. Normal alignment. Trace midfoot degenerative spurring. Small plantar calcaneal spur. Vascular calcifications are seen. No soft tissue air or radiopaque foreign body. IMPRESSION: 1. Stable appearance of great toe distal phalanx fracture compared to radiograph yesterday. There is slight adjacent periosteal reaction which may represent interval healing, however osteomyelitis could have a similar appearance. Recommend correlation for signs and symptoms of infection.  2. Small plantar calcaneal spur. Vascular calcifications. Electronically Signed   By: Keith Rake M.D.   On: 07/22/2021 23:21    Procedures Procedures   Medications Ordered in ED Medications - No data to display  ED Course  I have reviewed the triage vital signs and the nursing notes.  Pertinent labs & imaging results that were available during my care of the patient were reviewed by me and considered in my medical decision making (see chart for details).    MDM Rules/Calculators/A&P                          Jerry Dike Sr. is a 75 y.o. male presenting with toe pain. Initial VS sig for HTN.   Labs: Mild hyponatremia and hypokalemia, CMP otherwise unremarkable.  CBC WNL.  Lactic acid 1.5. ESR and CRP pending.  Imaging: Left foot x-ray notable for prior great toe fracture.  Small periosteal reaction noted.  Imaging was reviewed by radiology and personally by me.  DDX considered: Trauma, osteomyelitis, cellulitis, abscess, sepsis, compartment syndrome. History, examination, and objective data most consistent with likely cellulitis.  No signs of new trauma on exam.  Compartments soft and neurovascularly intact.  No palpable induration or fluctuance consistent with drainable abscess.  No systemic infectious signs or symptoms and patient is overall well-appearing.  Low suspicion for osteomyelitis based on x-ray, she has not had MRI at this time.  Given lack of infectious signs or symptoms and overall well appearance, I do not suspect osteomyelitis at this time.  If inflammatory markers are elevated, will consider MRI versus admission for further work-up.  Medications: Medications - No data to display   Re-evaluated prior to shift change. Hemodynamically stable and in no acute distress.  Oncoming provider to follow-up on pending inflammatory markers.  Final disposition pending, anticipate possible discharge home if stable without elevated labs.  Consider MRI versus admission for  osteomyelitis treatment if elevated.  Further management per oncoming team.    Oncoming provider to follow-up on pending labs.  Patient understands  and agrees with the plan.  Raquel Sarna updated at the bedside.  Transfer of care note: I transferred care of this patient at the end of my shift.  I answered the oncoming team's questions to the best my ability.  I provided the patient's pertinent history, work-up, and plan at the time of shift change.   The plan for this patient was discussed with my attending physician, who voiced agreement and who oversaw evaluation and treatment of this patient.     Note: Estate manager/land agent was used in the creation of this note.  Final Clinical Impression(s) / ED Diagnoses Final diagnoses:  Cellulitis of toe of left foot    Rx / DC Orders ED Discharge Orders     None        Cherly Hensen, DO 07/23/21 0116    Luna Fuse, MD 07/27/21 [phone]

## 2021-07-22 NOTE — ED Triage Notes (Signed)
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 History of diabetes, reports pain is minimal. Patient alert, oriented, and in no apparent distress at this time.

## 2021-07-22 NOTE — ED Notes (Addendum)
Patient transported to X-ray 

## 2021-07-23 ENCOUNTER — Emergency Department (HOSPITAL_COMMUNITY): Payer: PPO

## 2021-07-23 DIAGNOSIS — S92425A Nondisplaced fracture of distal phalanx of left great toe, initial encounter for closed fracture: Secondary | ICD-10-CM | POA: Diagnosis not present

## 2021-07-23 DIAGNOSIS — M7989 Other specified soft tissue disorders: Secondary | ICD-10-CM | POA: Diagnosis not present

## 2021-07-23 LAB — SEDIMENTATION RATE: Sed Rate: 5 mm/hr (ref 0–16)

## 2021-07-23 LAB — C-REACTIVE PROTEIN: CRP: 0.5 mg/dL (ref <1.0)

## 2021-07-23 NOTE — Discharge Instructions (Signed)
Continue antibiotics

## 2021-07-23 NOTE — ED Provider Notes (Signed)
00:30: Assumed care of patient at change of shift.  Please see prior provider note for full H&P.  Briefly patient is a 75 year old male who fractured his left great toe approximately 3 weeks ago, recently started having erythema and drainage, was placed on Keflex by PCP, had an x-ray concerning for osteomyelitis and was referred to the emergency department.  Xray today: : 1. Stable appearance of great toe distal phalanx fracture compared to radiograph yesterday. There is slight adjacent periosteal reaction which may represent interval healing, however osteomyelitis could have a similar appearance. Recommend correlation for signs and symptoms of infection. 2. Small plantar calcaneal spur. Vascular calcifications    CRP and sed rate are within normal limits.  Discussed with attending, plan for MRI to definitively rule out osteomyelitis, if negative for osteomyelitis can be discharged home.   Patient care signed out to morning emergency department team pending MRI and disposition.    Leafy Kindle 07/23/21 2411    Ezequiel Essex, MD 07/23/21 [phone]

## 2021-07-23 NOTE — ED Notes (Addendum)
Pt's left hallux has been wrapped with a medium curlex wrap per EDP, Sofia, request. Pt tolerated well. IV has also been removed. RN, Jory Sims., notified.

## 2021-07-23 NOTE — ED Notes (Signed)
Pt transported to MRI 

## 2021-07-23 NOTE — ED Notes (Signed)
Lab was notified that a ESR was needed as an add on and agreed

## 2021-07-23 NOTE — ED Notes (Signed)
Reviewed discharge instructions with patient and son. Follow-up care and continuation of current abx reviewed. Patient and son verbalized understanding. Patient A&Ox4, VSS, and ambulatory with steady gait upon discharge.

## 2021-07-23 NOTE — ED Provider Notes (Signed)
Pt's care assumed at [facility] pending for evaluation. MRi returns,  no osteo.  Pt advised to follow up as scheduled.  Continue current antibiotic     Sidney Ace 07/23/21 0932    Teressa Lower, MD 07/23/21 1818

## 2021-07-28 DIAGNOSIS — S92415D Nondisplaced fracture of proximal phalanx of left great toe, subsequent encounter for fracture with routine healing: Secondary | ICD-10-CM | POA: Diagnosis not present

## 2021-07-28 DIAGNOSIS — M47816 Spondylosis without myelopathy or radiculopathy, lumbar region: Secondary | ICD-10-CM | POA: Diagnosis not present

## 2021-07-28 DIAGNOSIS — M5136 Other intervertebral disc degeneration, lumbar region: Secondary | ICD-10-CM | POA: Diagnosis not present

## 2021-07-28 DIAGNOSIS — L089 Local infection of the skin and subcutaneous tissue, unspecified: Secondary | ICD-10-CM | POA: Diagnosis not present

## 2021-12-08 NOTE — Progress Notes (Signed)
? ? ? ? ?HPI: FU CAD; history of PCI of his right coronary artery in June 2000. His last carotid Dopplers in April 2011 showed 0-39% bilateral stenosis. Nuclear study May 2014 showed an ejection fraction of 59%. There was soft tissue attenuation but no ischemia. Abdominal ultrasound October 2021 showed 2.9 cm abdominal aortic aneurysm and follow-up recommended 24 months.  Since I last saw him, there is no chest pain, dyspnea, palpitations or syncope. ? ? ?Current Outpatient Medications  ?Medication Sig Dispense Refill  ? amLODipine (NORVASC) 10 MG tablet Take 10 mg by mouth daily.    ? aspirin 81 MG tablet Take 81 mg by mouth daily.    ? atorvastatin (LIPITOR) 80 MG tablet Take 40 mg by mouth daily. Takes 0.5 tablet daily    ? betamethasone valerate (VALISONE) 0.1 % cream Apply 1 application topically 2 (two) times daily as needed (itching/rash).    ? carvedilol (COREG) 25 MG tablet Take 12.5 mg by mouth 2 (two) times daily with a meal.     ? cholecalciferol (VITAMIN D) 1000 units tablet Take 1,000 Units by mouth daily.    ? gabapentin (NEURONTIN) 600 MG tablet Take 600 mg by mouth 2 (two) times daily.  3  ? glimepiride (AMARYL) 4 MG tablet Take 4-6 mg by mouth See admin instructions. 4 mg in the morning ? 6 mg with dinner    ? hydrochlorothiazide (MICROZIDE) 12.5 MG capsule Take 12.5 mg by mouth daily.    ? metFORMIN (GLUCOPHAGE) 1000 MG tablet Take 1,000 mg by mouth 2 (two) times daily with a meal.    ? Multiple Vitamins-Minerals (CENTRUM SILVER 50+MEN PO) Take 1 tablet by mouth daily.    ? pioglitazone (ACTOS) 45 MG tablet Take 45 mg by mouth daily.    ? saw palmetto 500 MG capsule Take 1,000 mg by mouth daily.    ? vitamin B-12 (CYANOCOBALAMIN) 1000 MCG tablet Take 1,000 mcg by mouth daily.    ? cephALEXin (KEFLEX) 500 MG capsule Take 500 mg by mouth See admin instructions. Bid x 7 days (Patient not taking: Reported on 12/19/2021)    ? ?No current facility-administered medications for this visit.  ? ? ? ?Past  Medical History:  ?Diagnosis Date  ? AAA   ? CAD   ? CEREBROVASCULAR DISEASE   ? DM   ? HYPERLIPIDEMIA   ? HYPERTENSION   ? ? ?History reviewed. No pertinent surgical history. ? ?Social History  ? ?Socioeconomic History  ? Marital status: Married  ?  Spouse name: Not on file  ? Number of children: Not on file  ? Years of education: Not on file  ? Highest education level: Not on file  ?Occupational History  ? Not on file  ?Tobacco Use  ? Smoking status: Former  ? Smokeless tobacco: Never  ?Substance and Sexual Activity  ? Alcohol use: Yes  ? Drug use: No  ? Sexual activity: Not on file  ?Other Topics Concern  ? Not on file  ?Social History Narrative  ? Not on file  ? ?Social Determinants of Health  ? ?Financial Resource Strain: Not on file  ?Food Insecurity: Not on file  ?Transportation Needs: Not on file  ?Physical Activity: Not on file  ?Stress: Not on file  ?Social Connections: Not on file  ?Intimate Partner Violence: Not on file  ? ? ?History reviewed. No pertinent family history. ? ?ROS: no fevers or chills, productive cough, hemoptysis, dysphasia, odynophagia, melena, hematochezia, dysuria, hematuria, rash, seizure activity,  orthopnea, PND, pedal edema, claudication. Remaining systems are negative. ? ?Physical Exam: ?Well-developed well-nourished in no acute distress.  ?Skin is warm and dry.  ?HEENT is normal.  ?Neck is supple.  ?Chest is clear to auscultation with normal expansion.  ?Cardiovascular exam is regular rate and rhythm.  ?Abdominal exam nontender or distended. No masses palpated. ?Extremities show no edema. ?neuro grossly intact ? ?ECG-normal sinus rhythm with ventricular trigeminy, left axis deviation.  Personally reviewed ? ?A/P ? ?1 coronary artery disease-patient doing well with no chest pain.  Continue aspirin and statin. ? ?2 abdominal aortic aneurysm-plan follow-up ultrasound October 2023. ? ?3 hypertension-blood pressure controlled.  Continue present medications.   ? ?4  hyperlipidemia-continue statin.  Last LDL 81.  Add Zetia 10 mg daily.   ? ?Kirk Ruths, MD ? ? ? ?

## 2021-12-19 ENCOUNTER — Other Ambulatory Visit: Payer: Self-pay

## 2021-12-19 ENCOUNTER — Ambulatory Visit: Payer: PPO | Admitting: Cardiology

## 2021-12-19 ENCOUNTER — Encounter: Payer: Self-pay | Admitting: Cardiology

## 2021-12-19 VITALS — BP 126/64 | HR 72 | Ht 70.0 in | Wt 226.6 lb

## 2021-12-19 DIAGNOSIS — I10 Essential (primary) hypertension: Secondary | ICD-10-CM | POA: Diagnosis not present

## 2021-12-19 DIAGNOSIS — I2510 Atherosclerotic heart disease of native coronary artery without angina pectoris: Secondary | ICD-10-CM

## 2021-12-19 DIAGNOSIS — I714 Abdominal aortic aneurysm, without rupture, unspecified: Secondary | ICD-10-CM | POA: Diagnosis not present

## 2021-12-19 NOTE — Patient Instructions (Signed)

## 2022-06-12 ENCOUNTER — Other Ambulatory Visit: Payer: Self-pay | Admitting: *Deleted

## 2022-06-12 DIAGNOSIS — I714 Abdominal aortic aneurysm, without rupture, unspecified: Secondary | ICD-10-CM

## 2022-08-14 ENCOUNTER — Ambulatory Visit (HOSPITAL_COMMUNITY)
Admission: RE | Admit: 2022-08-14 | Discharge: 2022-08-14 | Disposition: A | Discharge status: Home / Self Care | Payer: PPO | Source: Ambulatory Visit | Attending: Internal Medicine | Admitting: Internal Medicine

## 2022-08-14 DIAGNOSIS — I7143 Infrarenal abdominal aortic aneurysm, without rupture: Secondary | ICD-10-CM | POA: Diagnosis not present

## 2022-08-14 DIAGNOSIS — I714 Abdominal aortic aneurysm, without rupture, unspecified: Secondary | ICD-10-CM | POA: Insufficient documentation

## 2022-08-15 ENCOUNTER — Encounter: Payer: Self-pay | Admitting: *Deleted

## 2022-11-24 IMAGING — MR MR FOOT*L* W/O CM
5 series · 40 of 40 positions shown · non-contrast
Comparison: None.

CLINICAL DATA: Foot swelling. Distal great toe fracture versus
infection.

EXAM:
MRI OF THE LEFT FOOT WITHOUT CONTRAST
TECHNIQUE: Multiplanar, multisequence MR imaging of the left great toe was
performed. No intravenous contrast was administered.

[Series 5: T1 · axial · left · 3.0mm · 0.47mm/px · z∈[-61,+20]mm · 6 of 26 slices shown (1 of 2)]
[im 1/26]
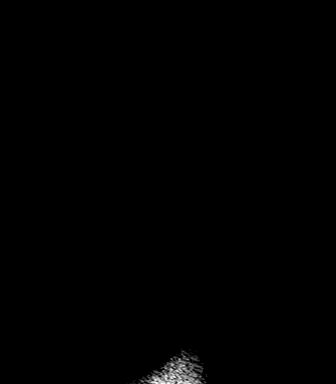
[im 6/26]
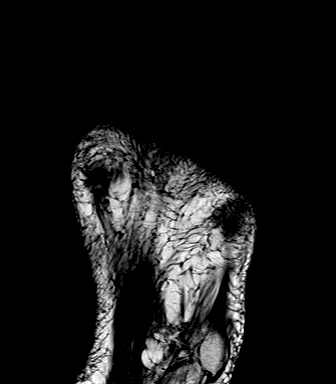
[im 11/26]
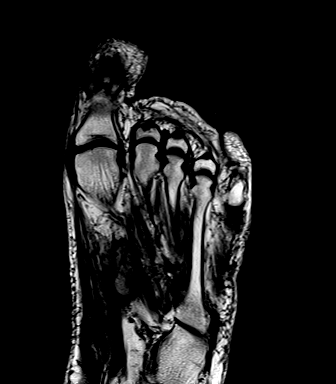
[im 16/26]
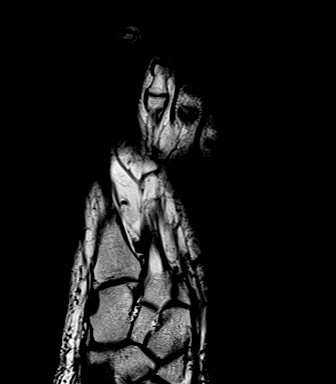
[im 21/26]
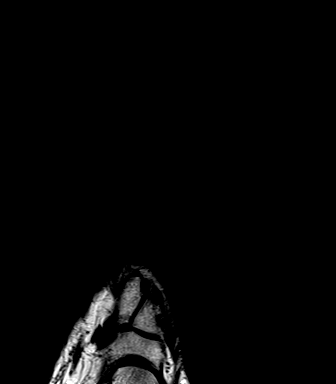
[im 26/26]
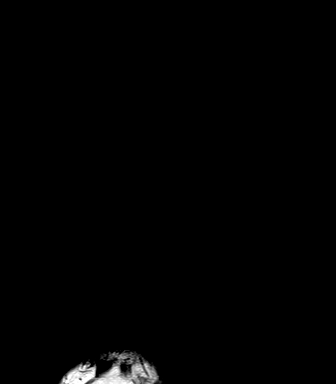

[Series 7: T1 · coronal · left · 3.0mm · 0.52mm/px · 11 of 44 slices shown (2 of 2)]
[im 1/44]
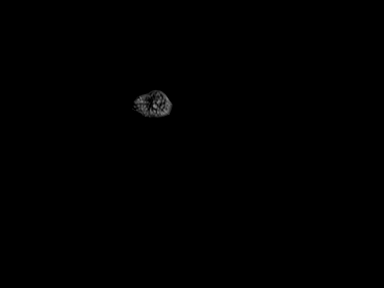
[im 5/44]
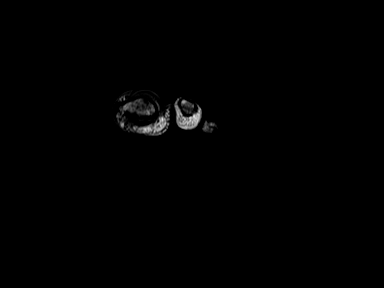
[im 9/44]
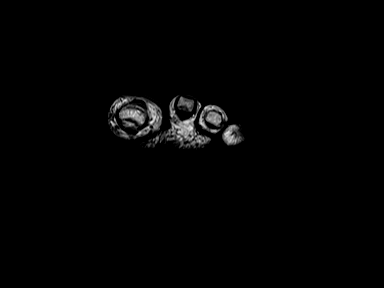
[im 13/44]
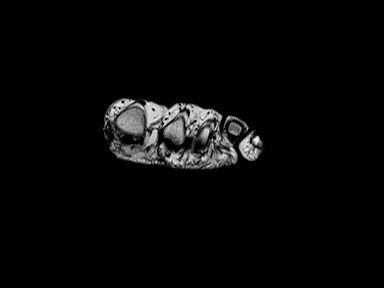
[im 18/44]
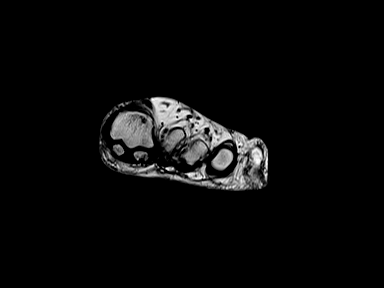
[im 22/44]
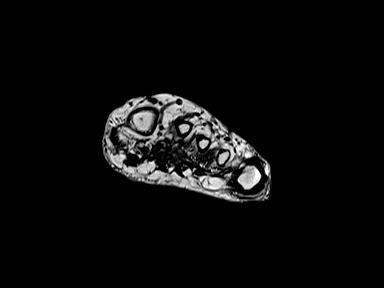
[im 26/44]
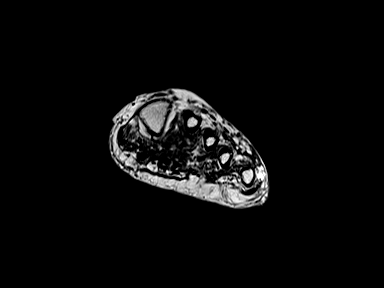
[im 31/44]
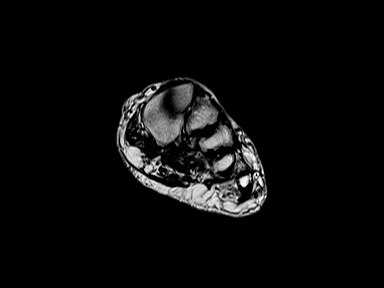
[im 35/44]
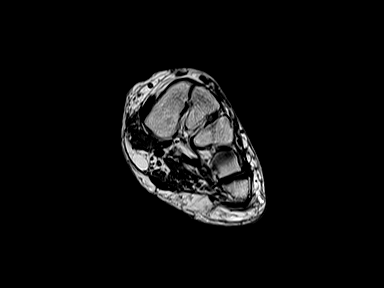
[im 39/44]
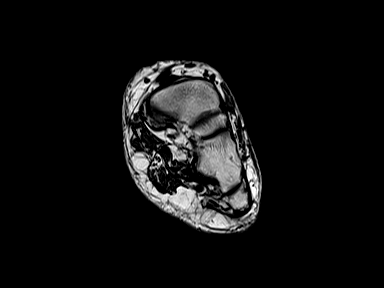
[im 44/44]
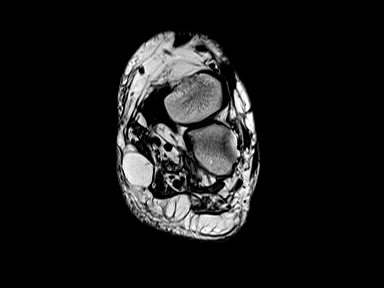

[Series 8: T2 fat-sat · coronal · left · 3.0mm · 0.36mm/px · 11 of 44 slices shown (1 of 2)]
[im 1/44]
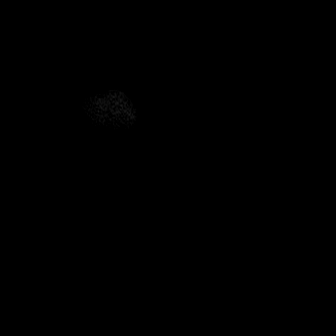
[im 5/44]
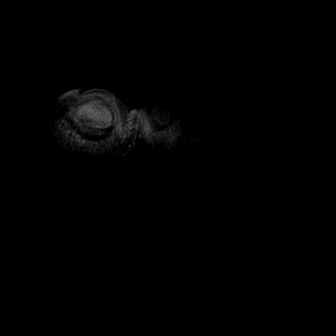
[im 9/44]
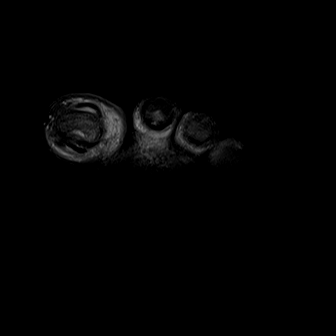
[im 13/44]
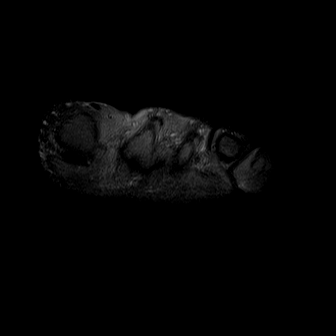
[im 18/44]
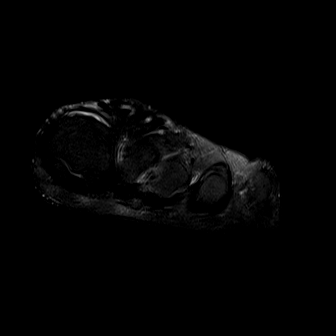
[im 22/44]
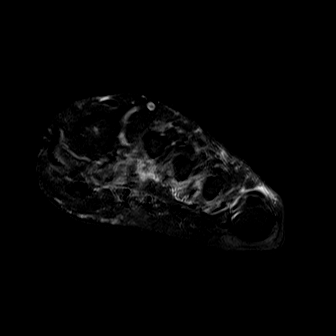
[im 26/44]
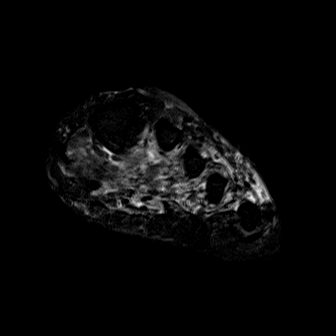
[im 31/44]
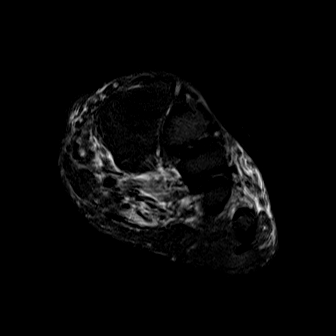
[im 35/44]
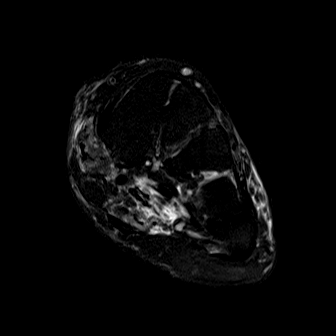
[im 39/44]
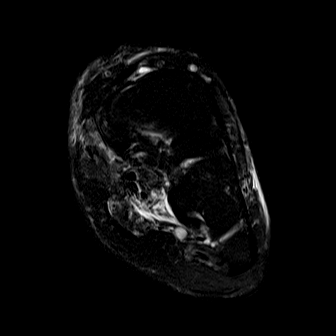
[im 44/44]
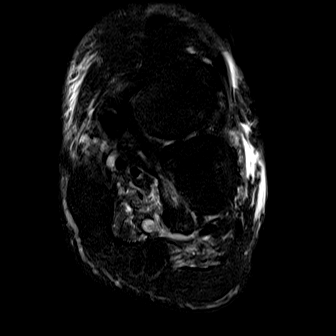

[Series 9: T2 fat-sat · axial · left · 3.0mm · 0.49mm/px · z∈[-59,+23]mm · 6 of 26 slices shown (2 of 2)]
[im 1/26]
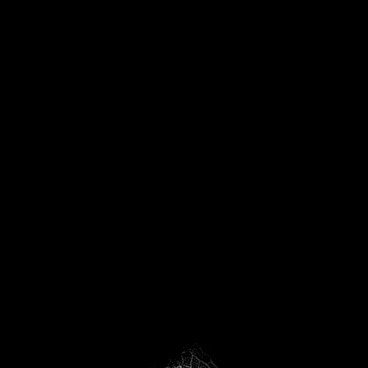
[im 6/26]
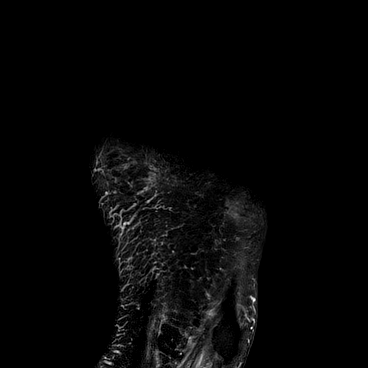
[im 11/26]
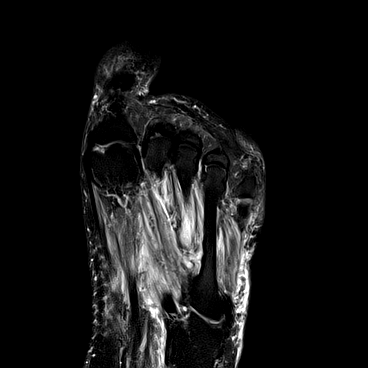
[im 16/26]
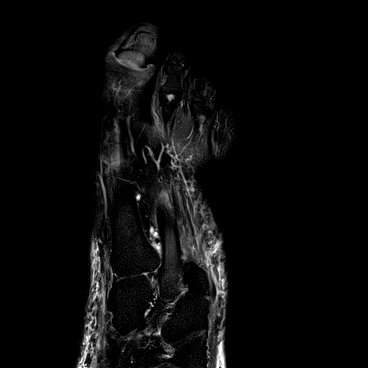
[im 21/26]
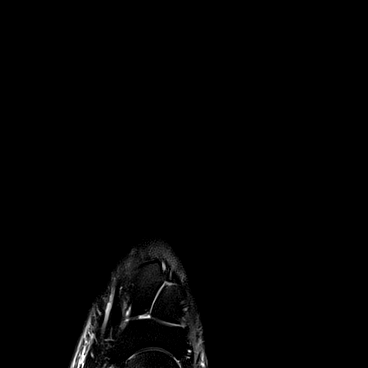
[im 26/26]
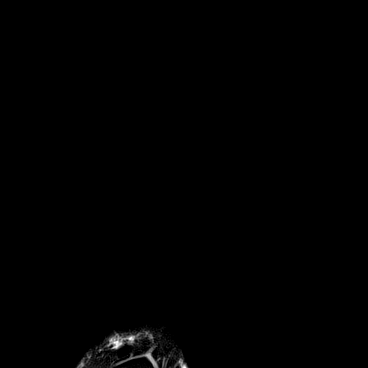

[Series 10: STIR · sagittal · left · 3.0mm · 0.70mm/px · 6 of 27 slices shown]
[im 1/27]
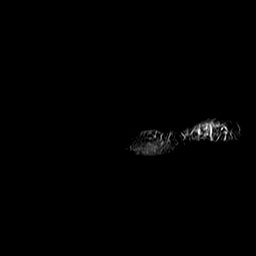
[im 6/27]
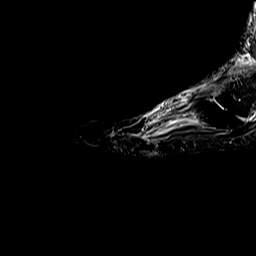
[im 11/27]
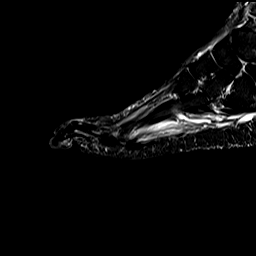
[im 16/27]
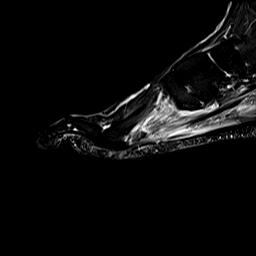
[im 21/27]
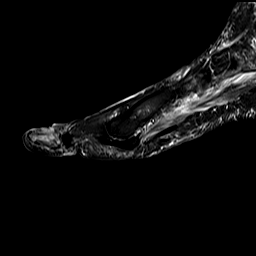
[im 27/27]
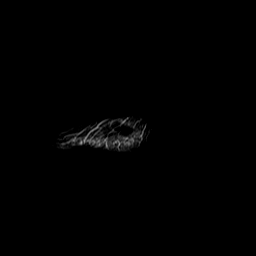

[40 of 40 positions shown; findings below may reference images not displayed]

FINDINGS: Bones/Joint/Cartilage

Transverse nondisplaced fracture of the base of the first distal
phalanx with severe surrounding bone marrow edema. Normal alignment.
No joint effusion.

No other marrow signal abnormality. No other acute fracture or
dislocation.

Ligaments

Collateral ligaments are intact.  Lisfranc ligament is intact.

Muscles and Tendons

Flexor, peroneal and extensor compartment tendons are intact. Edema
in the plantar musculature likely neurogenic.

Soft tissue
No fluid collection or hematoma.  No soft tissue mass.
IMPRESSION: 1. Transverse nondisplaced fracture of the base of the first distal
phalanx with surrounding bone marrow edema.

## 2023-06-12 NOTE — Progress Notes (Signed)
HPI: FU CAD; history of PCI of his right coronary artery in June 2000. His last carotid Dopplers in April 2011 showed 0-39% bilateral stenosis. Nuclear study May 2014 showed an ejection fraction of 59%. There was soft tissue attenuation but no ischemia.  Abdominal ultrasound November 2023 showed 2.9 cm abdominal aortic aneurysm and follow-up recommended 24 months.  Since I last saw him, the patient denies any dyspnea on exertion, orthopnea, PND, pedal edema, palpitations, syncope or chest pain.   Current Outpatient Medications  Medication Sig Dispense Refill   amLODipine (NORVASC) 10 MG tablet Take 10 mg by mouth daily.     aspirin 81 MG tablet Take 81 mg by mouth daily.     atorvastatin (LIPITOR) 80 MG tablet Take 40 mg by mouth daily. Takes 0.5 tablet daily     betamethasone valerate (VALISONE) 0.1 % cream Apply 1 application topically 2 (two) times daily as needed (itching/rash).     carvedilol (COREG) 25 MG tablet Take 12.5 mg by mouth 2 (two) times daily with a meal.      cholecalciferol (VITAMIN D) 1000 units tablet Take 1,000 Units by mouth daily.     gabapentin (NEURONTIN) 600 MG tablet Take 600 mg by mouth 2 (two) times daily.  3   glimepiride (AMARYL) 4 MG tablet Take 4-6 mg by mouth See admin instructions. 4 mg in the morning  6 mg with dinner     hydrochlorothiazide (MICROZIDE) 12.5 MG capsule Take 12.5 mg by mouth daily.     metFORMIN (GLUCOPHAGE) 1000 MG tablet Take 1,000 mg by mouth 2 (two) times daily with a meal.     Multiple Vitamins-Minerals (CENTRUM SILVER 50+MEN PO) Take 1 tablet by mouth daily.     pioglitazone (ACTOS) 45 MG tablet Take 45 mg by mouth daily.     saw palmetto 500 MG capsule Take 1,000 mg by mouth daily.     vitamin B-12 (CYANOCOBALAMIN) 1000 MCG tablet Take 1,000 mcg by mouth daily.     No current facility-administered medications for this visit.     Past Medical History:  Diagnosis Date   AAA    CAD    CEREBROVASCULAR DISEASE    DM     HYPERLIPIDEMIA    HYPERTENSION     History reviewed. No pertinent surgical history.  Social History   Socioeconomic History   Marital status: Married    Spouse name: Not on file   Number of children: Not on file   Years of education: Not on file   Highest education level: Not on file  Occupational History   Not on file  Tobacco Use   Smoking status: Former   Smokeless tobacco: Never  Substance and Sexual Activity   Alcohol use: Yes   Drug use: No   Sexual activity: Not on file  Other Topics Concern   Not on file  Social History Narrative   Not on file   Social Determinants of Health   Financial Resource Strain: Not on file  Food Insecurity: Not on file  Transportation Needs: Not on file  Physical Activity: Not on file  Stress: Not on file  Social Connections: Not on file  Intimate Partner Violence: Not on file    History reviewed. No pertinent family history.  ROS: no fevers or chills, productive cough, hemoptysis, dysphasia, odynophagia, melena, hematochezia, dysuria, hematuria, rash, seizure activity, orthopnea, PND, pedal edema, claudication. Remaining systems are negative.  Physical Exam: Well-developed well-nourished in no acute distress.  Skin  is warm and dry.  HEENT is normal.  Neck is supple.  Chest is clear to auscultation with normal expansion.  Cardiovascular exam is regular rate and rhythm.  Abdominal exam nontender or distended. No masses palpated. Extremities show no edema. neuro grossly intact  EKG Interpretation Date/Time:  Tuesday June 26 2023 08:35:57 EDT Ventricular Rate:  62 PR Interval:  178 QRS Duration:  88 QT Interval:  422 QTC Calculation: 428 R Axis:   -33  Text Interpretation: Normal sinus rhythm Left axis deviation Nonspecific ST and T wave abnormality When compared with ECG of 29-Jan-2013 09:45, No significant change was found Confirmed by Olga Millers ([id number]) on 06/26/2023 8:53:38 AM    A/P  1 coronary artery  disease-patient denies chest pain.  Continue aspirin and statin.  2 abdominal aortic aneurysm-plan follow-up ultrasound November 2025.  3 hyperlipidemia-continue statin.  4 hypertension-blood pressure controlled.  Continue present medications.  Olga Millers, MD

## 2023-06-26 ENCOUNTER — Encounter: Payer: Self-pay | Admitting: Cardiology

## 2023-06-26 ENCOUNTER — Ambulatory Visit: Payer: PPO | Attending: Cardiology | Admitting: Cardiology

## 2023-06-26 VITALS — BP 110/54 | HR 70 | Ht 70.0 in | Wt 229.6 lb

## 2023-06-26 DIAGNOSIS — I714 Abdominal aortic aneurysm, without rupture, unspecified: Secondary | ICD-10-CM | POA: Diagnosis not present

## 2023-06-26 DIAGNOSIS — I2510 Atherosclerotic heart disease of native coronary artery without angina pectoris: Secondary | ICD-10-CM | POA: Diagnosis not present

## 2023-06-26 DIAGNOSIS — I10 Essential (primary) hypertension: Secondary | ICD-10-CM | POA: Diagnosis not present

## 2024-07-08 ENCOUNTER — Other Ambulatory Visit: Payer: Self-pay | Admitting: *Deleted

## 2024-07-08 DIAGNOSIS — I714 Abdominal aortic aneurysm, without rupture, unspecified: Secondary | ICD-10-CM

## 2024-08-14 ENCOUNTER — Ambulatory Visit: Payer: Self-pay | Admitting: Cardiology

## 2024-08-14 ENCOUNTER — Ambulatory Visit (HOSPITAL_COMMUNITY)
Admission: RE | Admit: 2024-08-14 | Discharge: 2024-08-14 | Disposition: A | Discharge status: Home / Self Care | Source: Ambulatory Visit | Attending: Cardiology | Admitting: Cardiology

## 2024-08-14 DIAGNOSIS — I714 Abdominal aortic aneurysm, without rupture, unspecified: Secondary | ICD-10-CM | POA: Diagnosis present

## 2024-08-15 ENCOUNTER — Other Ambulatory Visit: Payer: Self-pay

## 2024-08-15 DIAGNOSIS — I714 Abdominal aortic aneurysm, without rupture, unspecified: Secondary | ICD-10-CM
